# Patient Record
Sex: Male | Born: 1985
Health system: Southern US, Community
[De-identification: ages and names within clinical notes are randomized; demographics above are authoritative.]

## PROBLEM LIST (undated history)

## (undated) DIAGNOSIS — I493 Ventricular premature depolarization: Secondary | ICD-10-CM

## (undated) DIAGNOSIS — F32A Depression, unspecified: Secondary | ICD-10-CM

## (undated) DIAGNOSIS — R0600 Dyspnea, unspecified: Secondary | ICD-10-CM

## (undated) DIAGNOSIS — Z950 Presence of cardiac pacemaker: Secondary | ICD-10-CM

## (undated) DIAGNOSIS — J309 Allergic rhinitis, unspecified: Secondary | ICD-10-CM

## (undated) DIAGNOSIS — J45909 Unspecified asthma, uncomplicated: Secondary | ICD-10-CM

## (undated) DIAGNOSIS — S6990XA Unspecified injury of unspecified wrist, hand and finger(s), initial encounter: Secondary | ICD-10-CM

## (undated) DIAGNOSIS — Q203 Discordant ventriculoarterial connection: Secondary | ICD-10-CM

## (undated) DIAGNOSIS — I519 Heart disease, unspecified: Secondary | ICD-10-CM

## (undated) DIAGNOSIS — I499 Cardiac arrhythmia, unspecified: Secondary | ICD-10-CM

## (undated) DIAGNOSIS — I495 Sick sinus syndrome: Secondary | ICD-10-CM

## (undated) DIAGNOSIS — C801 Malignant (primary) neoplasm, unspecified: Secondary | ICD-10-CM

## (undated) DIAGNOSIS — F329 Major depressive disorder, single episode, unspecified: Secondary | ICD-10-CM

## (undated) DIAGNOSIS — I209 Angina pectoris, unspecified: Secondary | ICD-10-CM

## (undated) HISTORY — PX: TRANSPOSITION OF GREAT VESSELS REPAIR: SHX815

## (undated) HISTORY — DX: Ventricular premature depolarization: I49.3

## (undated) HISTORY — DX: Sick sinus syndrome: I49.5

## (undated) HISTORY — DX: Major depressive disorder, single episode, unspecified: F32.9

## (undated) HISTORY — DX: Unspecified injury of unspecified wrist, hand and finger(s), initial encounter: S69.90XA

## (undated) HISTORY — DX: Allergic rhinitis, unspecified: J30.9

## (undated) HISTORY — DX: Discordant ventriculoarterial connection: Q20.3

## (undated) HISTORY — DX: Depression, unspecified: F32.A

## (undated) HISTORY — PX: WISDOM TOOTH EXTRACTION: SHX21

## (undated) HISTORY — DX: Unspecified asthma, uncomplicated: J45.909

## (undated) HISTORY — DX: Heart disease, unspecified: I51.9

---
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
## 2005-05-06 ENCOUNTER — Ambulatory Visit: Payer: Self-pay | Admitting: Internal Medicine

## 2006-11-24 ENCOUNTER — Emergency Department (HOSPITAL_COMMUNITY): Admission: EM | Admit: 2006-11-24 | Discharge: 2006-11-24 | Payer: Self-pay | Admitting: Emergency Medicine

## 2007-04-15 ENCOUNTER — Encounter: Payer: Self-pay | Admitting: Internal Medicine

## 2007-05-13 ENCOUNTER — Ambulatory Visit: Payer: Self-pay | Admitting: Internal Medicine

## 2007-05-19 DIAGNOSIS — F329 Major depressive disorder, single episode, unspecified: Secondary | ICD-10-CM | POA: Insufficient documentation

## 2008-03-08 ENCOUNTER — Ambulatory Visit: Payer: Self-pay | Admitting: Internal Medicine

## 2008-03-22 ENCOUNTER — Ambulatory Visit: Payer: Self-pay | Admitting: Internal Medicine

## 2008-03-22 LAB — CONVERTED CEMR LAB: Rapid Strep: NEGATIVE

## 2008-11-18 ENCOUNTER — Ambulatory Visit: Payer: Self-pay | Admitting: Internal Medicine

## 2008-11-18 DIAGNOSIS — J069 Acute upper respiratory infection, unspecified: Secondary | ICD-10-CM | POA: Insufficient documentation

## 2011-01-22 NOTE — Assessment & Plan Note (Signed)
[facility]                             PULMONARY OFFICE NOTE   John Myers, John Myers                      MRN:          [id number]  DATE:05/13/2007                            DOB:          06/11/1986    PROBLEM:  This was a young man previously followed at [facility] in 2002, referred then by his family practice doctor for allergy  evaluation of asthma and allergic rhinitis.   HISTORY:  He was skin test positive at [facility] years ago, but I do not  think he was on vaccine.  He has a long history of rhinitis with nasal  congestion, sneezing, drainage and frontal headaches, especially in the  spring and summer.  He is currently much better, expecting improvement  with the fall season.  He has had some recent epistaxis mostly from the  right nare.  He does not get asthma.  He takes lots of Benadryl which  mother describes as tipping up the bottle and drinking it.  This makes  him a little drowsy and cranky.  He has tried most available over-the-  counter antihistamine and antihistamine decongestant products.  He helps  to keep his bedroom as cool as possible.  He is very much worse after  mowing lawns but has not recognized symptoms particularly with mold,  animals or other specific exposures.  Nonspecific irritants including  the strong smell of gasoline cause nasal congestion.  He denies special  problems with foods, insect stings, aspirin, contrast or latex.  He has  failed benefits substantially from Zyrtec or Alavert.  He thinks  Singulair did help until he ran out.   REVIEW OF SYSTEMS:  Occasional palpitations, acid indigestion,  headaches, nasal congestion, sneezing and itching.  Mucus is sometimes  discolored.   MEDICATIONS:  1. Benadryl.  2. Sudafed.  3. Tavist.  4. Actifed.   ALLERGIES:  NO KNOWN DRUG ALLERGIES.   PAST MEDICAL HISTORY:  1. 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  2. Asthma.  3. Allergic rhinitis.  4. Chronic headaches.  5. He has had no ENT surgery and mother says he had few infections.  6. Jaundice was diagnosed as Gilbert's syndrome.  7. Acid reflux.   SOCIAL HISTORY:  Regular beer drinker.  No tobacco.  He is not married.  Works as a Gaffer doing yard work, some odd jobs, Sales promotion account executive.  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  When he mows lawns, he does  come back significantly congested in head.   FAMILY HISTORY:  Allergies and asthma on mother's side.  Allergy vaccine  definitely helped his sister.   ENVIRONMENTAL:  They live in an older home.  No basement.  There is  central air, wood floor, one dog, no smokers.  His bedroom has deer  heads and mounted ducks which we discussed as dust trappers.  They have  put encasing on his mattress but not on his pillow.   OBJECTIVE:  VITAL SIGNS:  Weight 161 pounds, blood  pressure 100/62,  pulse 55, room air saturation 98%.  HEENT:  Persistent sniffing, mucoid rhinitis, tympanic membranes are  clear.  Throat looks clear.  Tonsils are atrophic.  No stridor.  Voice  quality normal.  CHEST:  Clear.  CARDIOVASCULAR:  Heart sounds regular.  There is a 2-3/6 systolic murmur  best heard at the left sternal border with crisp valve closure.  No  diastolic component heard.  EXTREMITIES:  No cyanosis.   IMPRESSION:  Allergic rhinitis with grasping a clearly identified  allergen.   PLAN:  1. Environmental precautions including use of masks when mowing lawn.  2. Retry Singulair 10 mg daily.  3. Try Xyzal 5 mg daily p.r.n.  4. Try Nasonex 2 sprays each nostril daily, tapering to one spray each      nostril daily for maintenance.  5. Schedule return two months, earlier p.r.n.  6. Skin test p.r.n.     Clinton D. Maple Hudson, MD, Tonny Bollman, FACP  Electronically Signed    CDY/MedQ  DD: 05/13/2007  DT: 05/14/2007  Job #: [id number]   cc:   Gordy Savers, MD

## 2011-05-02 ENCOUNTER — Telehealth: Payer: Self-pay

## 2011-05-02 NOTE — Telephone Encounter (Signed)
Triaged a call- father in route to pick up son  "something lodged in throat and vomiting" per dad. Wants to come straight here to be seen - I explained we would be happy to see - but if he has something stuck in throat and it need to be removed we would not be able to do that here - recommend ER . Dad was hoping to avoid ER - but I again instructed that if anything lodge in throat , SOB that would be better seen thru ER ,they would have equipment to eval and remove but if he brought him in we would see . Verbalized understanding. KIK

## 2012-04-06 ENCOUNTER — Encounter: Payer: Self-pay | Admitting: Internal Medicine

## 2012-04-06 ENCOUNTER — Ambulatory Visit (INDEPENDENT_AMBULATORY_CARE_PROVIDER_SITE_OTHER): Payer: Self-pay | Admitting: Internal Medicine

## 2012-04-06 VITALS — BP 100/70 | Temp 98.1°F | Wt 155.0 lb

## 2012-04-06 DIAGNOSIS — B029 Zoster without complications: Secondary | ICD-10-CM

## 2012-04-06 MED ORDER — HYDROCODONE-ACETAMINOPHEN 5-500 MG PO TABS: 1.0000 | ORAL_TABLET | ORAL | Status: DC | PRN

## 2012-04-06 MED ORDER — VALACYCLOVIR HCL 1 G PO TABS: 1000.0000 mg | ORAL_TABLET | Freq: Two times a day (BID) | ORAL | Status: DC

## 2012-04-06 MED ORDER — GABAPENTIN 100 MG PO CAPS: 100.0000 mg | ORAL_CAPSULE | Freq: Three times a day (TID) | ORAL | Status: DC

## 2012-04-06 NOTE — Progress Notes (Signed)
  Subjective:    Patient ID: John Myers, male    DOB: 01/30/1986, 26 y.o.   MRN: [id number]  HPI  26 year old patient who presents with a one-week history of an extensive rash involving his right chest wall area   Review of Systems  Skin: Positive for rash.       Objective:   Physical Exam  Constitutional: He appears well-developed and well-nourished. No distress.  Skin: Rash noted.       Extensive herpetic rash involving the right chest wall area that extends from the midline anteriorly to the midline posteriorly          Assessment & Plan:   Shingles. Will treat with Valtrex Neurontin as well as analgesics A note for work also dispensed

## 2012-04-06 NOTE — Patient Instructions (Addendum)
Shingles Shingles is caused by the same virus that causes chickenpox (varicella zoster virus or VZV). Shingles often occurs many years or decades after having chickenpox. That is why it is more common in adults older than 50 years. The virus reactivates and breaks out as an infection in a nerve root. SYMPTOMS    The initial feeling (sensations) may be pain. This pain is usually described as:   Burning.   Stabbing.   Throbbing.   Tingling in the nerve root.   A red rash will follow in a couple days. The rash may occur in any area of the body and is usually on one side (unilateral) of the body in a band or belt-like pattern. The rash usually starts out as very small blisters (vesicles). They will dry up after 7 to 10 days. This is not usually a significant problem except for the pain it causes.   Long-lasting (chronic) pain is more likely in an elderly person. It can last months to years. This condition is called postherpetic neuralgia.  Shingles can be an extremely severe infection in someone with AIDS, a weakened immune system, or with forms of leukemia. It can also be severe if you are taking transplant medicines or other medicines that weaken the immune system. TREATMENT   Your caregiver will often treat you with:  Antiviral drugs.   Anti-inflammatory drugs.   Pain medicines.  Bed rest is very important in preventing the pain associated with herpes zoster (postherpetic neuralgia). Application of heat in the form of a hot water bottle or electric heating pad or gentle pressure with the hand is recommended to help with the pain or discomfort. PREVENTION   A varicella zoster vaccine is available to help protect against the virus. The Food and Drug Administration approved the varicella zoster vaccine for individuals 63 years of age and older. HOME CARE INSTRUCTIONS    Cool compresses to the area of rash may be helpful.   Only take over-the-counter or prescription medicines for pain,  discomfort, or fever as directed by your caregiver.   Avoid contact with:   Babies.   Pregnant women.   Children with eczema.   Elderly people with transplants.   People with chronic illnesses, such as leukemia and AIDS.   If the area involved is on your face, you may receive a referral for follow-up to a specialist. It is very important to keep all follow-up appointments. This will help avoid eye complications, chronic pain, or disability.  SEEK IMMEDIATE MEDICAL CARE IF:    You develop any pain (headache) in the area of the face or eye. This must be followed carefully by your caregiver or ophthalmologist. An infection in part of your eye (cornea) can be very serious. It could lead to blindness.   You do not have pain relief from prescribed medicines.   Your redness or swelling spreads.   The area involved becomes very swollen and painful.   You have a fever.   You notice any red or painful lines extending away from the affected area toward your heart (lymphangitis).   Your condition is worsening or has changed.  Document Released: 08/26/2005 Document Revised: 08/15/2011 Document Reviewed: 07/31/2009 [facility] Patient Information 2012 [place], [place].Postherpetic Neuralgia Shingles is a painful disease. It is caused by the herpes zoster virus. This is the same virus which also causes chickenpox. It can affect the torso, limbs, or the face. For most people, shingles is a condition of rather sudden onset. Pain usually lasts about  1 month. In older patients, or patients with poor immune systems, a painful, long-standing (chronic) condition called postherpetic neuralgia can develop. This condition rarely happens before age 41. But at least 50% of people over 50 become affected following an attack of shingles. There is a natural tendency for this condition to improve over time with no treatment. Less than 5% of patients have pain that lasts for more than 1 year. DIAGNOSIS   Herpes is  usually easily diagnosed on physical exam. Pain sometimes follows when the skin sores (lesions) have disappeared. It is called postherpetic neuralgia. That name simply means the pain that follows herpes. TREATMENT    Treating this condition may be difficult. Usually one of the tricyclic antidepressants, often amitriptyline, is the first line of treatment. There is evidence that the sooner these medications are given, the more likely they are to reduce pain.   Conventional analgesics, regional nerve blocks, and anticonvulsants have little benefit in most cases when used alone. Other tricyclic anti-depressants are used as a second option if the first antidepressant is unsuccessful.   Anticonvulsants, including carbamazepine, have been found to provide some added benefit when used with a tricyclic anti-depressant. This is especially for the stabbing type of pain similar to that of trigeminal neuralgia.   Chronic opioid therapy. This is a strong narcotic pain medication. It is used to treat pain that is resistant to other measures. The issues of dependency and tolerance can be reduced with closely managed care.   Some cream treatments are applied locally to the affected area. They can help when used with other treatments. Their use may be difficult in the case of postherpetic trigeminal neuralgia. This is involved with the face. So the substances can irritate the eye and the skin around the eye. Examples of creams used include Capsaicin and lidocaine creams.   For shingles, antiviral therapies along with analgesics are recommended. Studies of the effect of anti-viral agents such as acyclovir on shingles have been done. They show improved rates of healing and decreased severity of sudden (acute) pain. Some observations suggest that nerve blocks during shingles infection will:   Reduce pain.   Shorten the acute episode.   Prevent the emergence of postherpetic neuralgia.  Viral medications used include  Acyclovir (Zovirax), Valacyclovir, Famciclovir and a lysine diet. Document Released: 11/16/2002 Document Revised: 08/15/2011 Document Reviewed: 08/26/2005 [facility] Patient Information 2012 [place], [place].

## 2012-04-24 ENCOUNTER — Telehealth: Payer: Self-pay | Admitting: Internal Medicine

## 2012-04-24 MED ORDER — HYDROCODONE-ACETAMINOPHEN 5-500 MG PO TABS: 1.0000 | ORAL_TABLET | Freq: Four times a day (QID) | ORAL | Status: DC | PRN

## 2012-04-24 NOTE — Telephone Encounter (Signed)
Call in Vicodin #60 with no rf

## 2012-04-24 NOTE — Telephone Encounter (Signed)
Pt is still in pain from shingles and is requesting a refill on HYDROcodone-acetaminophen (VICODIN) 5-500 MG per tablet

## 2012-04-24 NOTE — Telephone Encounter (Signed)
Call to [facility] and attempt to call father - VM - (hm#) [id number] if questions - med called in

## 2012-04-24 NOTE — Telephone Encounter (Signed)
There is another phone note that I have sent to another provider for approval since dr. Amador Cunas is out of office until Monday. Just waiting for response.

## 2012-04-24 NOTE — Telephone Encounter (Signed)
Please advise - was given # [phone] 2RFs at 04/06/12 office visit His rash was bad at that time

## 2012-04-24 NOTE — Telephone Encounter (Signed)
Caller: [name]/Father; Patient Name: John Myers; PCP: Eleonore Chiquito; Best Callback Phone Number: [phone] seen in office on 04/06/12 and dx with Shingles and still has burning pain on R side. Skin is very sensitive but rash now gone. He did take full course of Valtrex. Taking Hydrocodone for pain and is almost out of pain med. He has 8 tabs left. (takes 2 tab PO @ HS)  REQUESTING REFILL- HE IS WILLING TO COME BACK INTO OFFICE FOR RECHECK IF NEEDED BUT NO NEW SX. Uses [facility] on [address], [phone].

## 2012-09-09 DIAGNOSIS — I495 Sick sinus syndrome: Secondary | ICD-10-CM

## 2012-09-09 HISTORY — DX: Sick sinus syndrome: I49.5

## 2013-01-22 ENCOUNTER — Ambulatory Visit (INDEPENDENT_AMBULATORY_CARE_PROVIDER_SITE_OTHER): Payer: BC Managed Care – PPO | Admitting: Internal Medicine

## 2013-01-22 ENCOUNTER — Encounter: Payer: Self-pay | Admitting: Internal Medicine

## 2013-01-22 VITALS — BP 110/70 | HR 62 | Temp 98.5°F | Resp 18 | Wt 158.0 lb

## 2013-01-22 DIAGNOSIS — J309 Allergic rhinitis, unspecified: Secondary | ICD-10-CM | POA: Insufficient documentation

## 2013-01-22 DIAGNOSIS — J3089 Other allergic rhinitis: Secondary | ICD-10-CM | POA: Insufficient documentation

## 2013-01-22 DIAGNOSIS — F329 Major depressive disorder, single episode, unspecified: Secondary | ICD-10-CM

## 2013-01-22 MED ORDER — METHYLPREDNISOLONE ACETATE 80 MG/ML IJ SUSP
80.0000 mg | Freq: Once | INTRAMUSCULAR | Status: AC
  Administered 2013-01-22: 80 mg via INTRAMUSCULAR

## 2013-01-22 NOTE — Progress Notes (Signed)
Subjective:    Patient ID: John Myers, male    DOB: 1985-11-09, 27 y.o.   MRN: [id number]  HPI 27 year old patient who has a history of seasonal allergic rhinitis worse in the spring. 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 He describes sinus fullness. There's been no fever localize sinus pain or purulent discharge. Denies any wheezing. He does use Benadryl with the benefit. This does not cause any drowsiness and nonsedating antihistamines have not been as helpful. His chief complaint today is sore throat  History reviewed. No pertinent past medical history.  History   Social History  . Marital Status: Single    Spouse Name: N/A    Number of Children: N/A  . Years of Education: N/A   Occupational History  . Not on file.   Social History Main Topics  . Smoking status: Never Smoker   . Smokeless tobacco: Never Used  . Alcohol Use: Yes  . Drug Use: No  . Sexually Active: Not on file   Other Topics Concern  . Not on file   Social History Narrative  . No narrative on file    History reviewed. No pertinent past surgical history.  No family history on file.  No Known Allergies  No current outpatient prescriptions on file prior to visit.   No current facility-administered medications on file prior to visit.    BP 110/70  Pulse 62  Temp(Src) 98.5 F (36.9 C) (Oral)  Resp 18  Wt 158 lb (71.668 kg)  SpO2 97%       Review of Systems  Constitutional: Negative for fever, chills, appetite change and fatigue.  HENT: Positive for congestion, sore throat, rhinorrhea, postnasal drip and sinus pressure. Negative for hearing loss, ear pain, trouble swallowing, neck stiffness, dental problem, voice change and tinnitus.   Eyes: Negative for pain, discharge and visual disturbance.  Respiratory: Negative for cough, chest tightness, wheezing and stridor.   Cardiovascular: Negative  for chest pain, palpitations and leg swelling.  Gastrointestinal: Negative for nausea, vomiting, abdominal pain, diarrhea, constipation, blood in stool and abdominal distention.  Genitourinary: Negative for urgency, hematuria, flank pain, discharge, difficulty urinating and genital sores.  Musculoskeletal: Negative for myalgias, back pain, joint swelling, arthralgias and gait problem.  Skin: Negative for rash.  Neurological: Negative for dizziness, syncope, speech difficulty, weakness, numbness and headaches.  Hematological: Negative for adenopathy. Does not bruise/bleed easily.  Psychiatric/Behavioral: Negative for behavioral problems and dysphoric mood. The patient is not nervous/anxious.        Objective:   Physical Exam  Constitutional: He is oriented to person, place, and time. He appears well-developed.  HENT:  Head: Normocephalic.  Right Ear: External ear normal.  Left Ear: External ear normal.  Oropharynx is erythematous  Eyes: Conjunctivae and EOM are normal.  Neck: Normal range of motion.  Cardiovascular: Normal rate and normal heart sounds.   Pulmonary/Chest: Breath sounds normal.  Abdominal: Bowel sounds are normal.  Musculoskeletal: Normal range of motion. He exhibits no edema and no tenderness.  Lymphadenopathy:    He has no cervical adenopathy.  Neurological: He is alert and oriented to person, place, and time.  Psychiatric: He has a normal mood and affect. His behavior is normal.          Assessment & Plan:   Allergic rhinitis flare. Will continue Benadryl when necessary bladder expectorants throat lozenge or as and Nasonex. Will  treat with Depo-Medrol 80

## 2013-01-22 NOTE — Patient Instructions (Signed)
Call or return to clinic prn if these symptoms worsen or fail to improve as anticipated. Allergic Rhinitis Allergic rhinitis is when the mucous membranes in the nose respond to allergens. Allergens are particles in the air that cause your body to have an allergic reaction. This causes you to release allergic antibodies. Through a chain of events, these eventually cause you to release histamine into the blood stream (hence the use of antihistamines). Although meant to be protective to the body, it is this release that causes your discomfort, such as frequent sneezing, congestion and an itchy runny nose.  CAUSES  The pollen allergens may come from grasses, trees, and weeds. This is seasonal allergic rhinitis, or "hay fever." Other allergens cause year-round allergic rhinitis (perennial allergic rhinitis) such as house dust mite allergen, pet dander and mold spores.  SYMPTOMS   Nasal stuffiness (congestion).  Runny, itchy nose with sneezing and tearing of the eyes.  There is often an itching of the mouth, eyes and ears. It cannot be cured, but it can be controlled with medications. DIAGNOSIS  If you are unable to determine the offending allergen, skin or blood testing may find it. TREATMENT   Avoid the allergen.  Medications and allergy shots (immunotherapy) can help.  Hay fever may often be treated with antihistamines in pill or nasal spray forms. Antihistamines block the effects of histamine. There are over-the-counter medicines that may help with nasal congestion and swelling around the eyes. Check with your caregiver before taking or giving this medicine. If the treatment above does not work, there are many new medications your caregiver can prescribe. Stronger medications may be used if initial measures are ineffective. Desensitizing injections can be used if medications and avoidance fails. Desensitization is when a patient is given ongoing shots until the body becomes less sensitive to the  allergen. Make sure you follow up with your caregiver if problems continue. SEEK MEDICAL CARE IF:   You develop fever (more than 100.5 F (38.1 C).  You develop a cough that does not stop easily (persistent).  You have shortness of breath.  You start wheezing.  Symptoms interfere with normal daily activities. Document Released: 05/21/2001 Document Revised: 11/18/2011 Document Reviewed: 11/30/2008 [facility] Patient Information 2013 [place], [place].

## 2013-02-05 ENCOUNTER — Encounter: Payer: Self-pay | Admitting: Internal Medicine

## 2013-02-05 ENCOUNTER — Ambulatory Visit (INDEPENDENT_AMBULATORY_CARE_PROVIDER_SITE_OTHER): Payer: BC Managed Care – PPO | Admitting: Internal Medicine

## 2013-02-05 VITALS — BP 110/74 | HR 48 | Resp 24 | Wt 154.0 lb

## 2013-02-05 DIAGNOSIS — R079 Chest pain, unspecified: Secondary | ICD-10-CM

## 2013-02-05 NOTE — Progress Notes (Signed)
  Subjective:    Patient ID: John Myers, male    DOB: [birth year], 27 y.o.   MRN: [id number]  HPI  27 year old patient who presents as a work in with a chief complaint of chest pain. This began last night at approximately 10:30 PM and was present when he awoke this morning the pain is described as a squeezing pressure pain in the mid chest area. He has been quite anxious and a bit short of breath. He is also to complain of some numbness and tingling involving his hands. He has a history of congenital heart disease at age 91 months underwent a Senning procedure in [place] for transposition of the great vessels. He is followed at [facility] in 3 days ago did have a followup cardiology evaluation. He apparently had treadmill exercise stress test performed with a peak heart rate of 190. An echocardiogram was also performed. According to his mother there was some concern about a blockage at the level of the pulmonary veins or pulmonary artery outflow track.  Further evaluation to include a cardiac MRI or possibly transesophageal echo were entertained. His cardiologist also were concerned about the possibility of a pacemaker we'll repeat open heart surgery.    Review of Systems  Constitutional: Negative for fever, chills, appetite change and fatigue.  HENT: Negative for hearing loss, ear pain, congestion, sore throat, trouble swallowing, neck stiffness, dental problem, voice change and tinnitus.   Eyes: Negative for pain, discharge and visual disturbance.  Respiratory: Positive for shortness of breath. Negative for cough, chest tightness, wheezing and stridor.   Cardiovascular: Positive for chest pain. Negative for palpitations and leg swelling.  Gastrointestinal: Negative for nausea, vomiting, abdominal pain, diarrhea, constipation, blood in stool and abdominal distention.  Genitourinary: Negative for urgency, hematuria, flank pain, discharge, difficulty urinating and genital sores.   Musculoskeletal: Negative for myalgias, back pain, joint swelling, arthralgias and gait problem.  Skin: Negative for rash.  Neurological: Positive for weakness and light-headedness. Negative for dizziness, syncope, speech difficulty, numbness and headaches.  Hematological: Negative for adenopathy. Does not bruise/bleed easily.  Psychiatric/Behavioral: Negative for behavioral problems and dysphoric mood. The patient is nervous/anxious.        Objective:   Physical Exam  Constitutional: He is oriented to person, place, and time. He appears well-developed. No distress.  Anxious no acute distress.  HENT:  Head: Normocephalic.  Right Ear: External ear normal.  Left Ear: External ear normal.  Eyes: Conjunctivae and EOM are normal.  Neck: Normal range of motion.  Cardiovascular: Normal rate and normal heart sounds.   Normal S1 single S2 Grade 3/6 systolic murmur loudest along the left sternal border  Pulmonary/Chest: Breath sounds normal.  Sternotomy scar  Abdominal: Bowel sounds are normal.  Musculoskeletal: Normal range of motion. He exhibits no edema and no tenderness.  Neurological: He is alert and oriented to person, place, and time.  Psychiatric: He has a normal mood and affect. His behavior is normal.          Assessment & Plan:   Congenital heart disease. Status post surgery for transposition of the great vessels. Chest pain syndrome. Options discussed with the patient parents and cardiology. The patient will be evaluated at [facility].

## 2013-02-05 NOTE — Patient Instructions (Signed)
Report to [facility], cardiology division immediately for further evaluation

## 2013-02-08 ENCOUNTER — Ambulatory Visit: Payer: BC Managed Care – PPO | Admitting: Family

## 2013-03-08 HISTORY — PX: PACEMAKER INSERTION: SHX728

## 2013-03-24 ENCOUNTER — Other Ambulatory Visit (HOSPITAL_COMMUNITY): Payer: Self-pay | Admitting: Cardiovascular Disease

## 2013-03-24 ENCOUNTER — Ambulatory Visit (HOSPITAL_COMMUNITY)
Admission: RE | Admit: 2013-03-24 | Discharge: 2013-03-24 | Disposition: A | Discharge status: Home / Self Care | Payer: BC Managed Care – PPO | Source: Ambulatory Visit | Attending: Cardiovascular Disease | Admitting: Cardiovascular Disease

## 2013-03-24 DIAGNOSIS — Q243 Pulmonary infundibular stenosis: Secondary | ICD-10-CM

## 2013-03-24 DIAGNOSIS — Z9889 Other specified postprocedural states: Secondary | ICD-10-CM

## 2013-03-24 DIAGNOSIS — Z950 Presence of cardiac pacemaker: Secondary | ICD-10-CM

## 2013-03-24 DIAGNOSIS — I519 Heart disease, unspecified: Secondary | ICD-10-CM

## 2013-03-24 DIAGNOSIS — R079 Chest pain, unspecified: Secondary | ICD-10-CM | POA: Insufficient documentation

## 2013-03-24 DIAGNOSIS — Q203 Discordant ventriculoarterial connection: Secondary | ICD-10-CM

## 2013-04-28 ENCOUNTER — Ambulatory Visit (HOSPITAL_COMMUNITY)
Admission: RE | Admit: 2013-04-28 | Discharge: 2013-04-28 | Disposition: A | Discharge status: Home / Self Care | Payer: BC Managed Care – PPO | Source: Ambulatory Visit | Attending: Cardiovascular Disease | Admitting: Cardiovascular Disease

## 2013-04-28 ENCOUNTER — Other Ambulatory Visit (HOSPITAL_COMMUNITY): Payer: Self-pay | Admitting: Cardiovascular Disease

## 2013-04-28 DIAGNOSIS — Z950 Presence of cardiac pacemaker: Secondary | ICD-10-CM

## 2013-04-28 DIAGNOSIS — Q249 Congenital malformation of heart, unspecified: Secondary | ICD-10-CM | POA: Insufficient documentation

## 2013-07-08 ENCOUNTER — Ambulatory Visit (INDEPENDENT_AMBULATORY_CARE_PROVIDER_SITE_OTHER): Payer: BC Managed Care – PPO | Admitting: Internal Medicine

## 2013-07-08 ENCOUNTER — Encounter: Payer: Self-pay | Admitting: Internal Medicine

## 2013-07-08 ENCOUNTER — Ambulatory Visit (INDEPENDENT_AMBULATORY_CARE_PROVIDER_SITE_OTHER)
Admission: RE | Admit: 2013-07-08 | Discharge: 2013-07-08 | Disposition: A | Discharge status: Home / Self Care | Payer: BC Managed Care – PPO | Source: Ambulatory Visit | Attending: Internal Medicine | Admitting: Internal Medicine

## 2013-07-08 VITALS — BP 110/50 | HR 74 | Temp 97.7°F | Wt 155.0 lb

## 2013-07-08 DIAGNOSIS — Q203 Discordant ventriculoarterial connection: Secondary | ICD-10-CM | POA: Insufficient documentation

## 2013-07-08 DIAGNOSIS — S6980XA Other specified injuries of unspecified wrist, hand and finger(s), initial encounter: Secondary | ICD-10-CM

## 2013-07-08 DIAGNOSIS — L089 Local infection of the skin and subcutaneous tissue, unspecified: Secondary | ICD-10-CM | POA: Insufficient documentation

## 2013-07-08 DIAGNOSIS — S6992XA Unspecified injury of left wrist, hand and finger(s), initial encounter: Secondary | ICD-10-CM

## 2013-07-08 DIAGNOSIS — Z950 Presence of cardiac pacemaker: Secondary | ICD-10-CM

## 2013-07-08 DIAGNOSIS — Z23 Encounter for immunization: Secondary | ICD-10-CM

## 2013-07-08 DIAGNOSIS — S6990XA Unspecified injury of unspecified wrist, hand and finger(s), initial encounter: Secondary | ICD-10-CM | POA: Insufficient documentation

## 2013-07-08 MED ORDER — AMOXICILLIN-POT CLAVULANATE 875-125 MG PO TABS: 1.0000 | ORAL_TABLET | Freq: Two times a day (BID) | ORAL | Status: DC

## 2013-07-08 NOTE — Patient Instructions (Addendum)
Get x ray today  wll let you know results  Add  Antibiotic and soak finger 3-4 x per day   At least warm soapy water   No work hands  Go back Monday   After recheck. Here  Seek emergent care if getting worse  Contact our office . First .

## 2013-07-08 NOTE — Progress Notes (Signed)
Chief Complaint  Patient presents with  . Left pointer finger swelling    Done last week.  Not sure what he cut it on.  Has some numbness.    HPI: Patient comes in today for SDA for  new problem evaluation. 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  Right handed  Had a cut left index finger last week some bleeding  Cleaned  Over the last 2-3 days increased swelling and pain  No fever ? If infected  No prev injury uncertain how cut no know FB poss nial vs razor blade . ROS: See pertinent positives and negatives per HPI. Had CHD s/p correction and recently had pacer for bradycardia and put on ace for dec RV function ( systemic ventriclle) sees [facility].  No tobacco  Cld.  Past Medical History  Diagnosis Date  . TGA (transposition of great arteries)     surgical correction ? which procedure at 3 months  . Cardiac pacemaker     june 2014    No family history on file.  History   Social History  . Marital Status: Single    Spouse Name: N/A    Number of Children: N/A  . Years of Education: N/A   Social History Main Topics  . Smoking status: Never Smoker   . Smokeless tobacco: Never Used  . Alcohol Use: Yes  . Drug Use: No  . Sexual Activity: None   Other Topics Concern  . None   Social History Narrative   Working    Scientist, product/process development     Outpatient Encounter Prescriptions as of 07/08/2013  Medication Sig Dispense Refill  . lisinopril (PRINIVIL,ZESTRIL) 5 MG tablet Take 5 mg by mouth daily.      Marland Kitchen amoxicillin-clavulanate (AUGMENTIN) 875-125 MG per tablet Take 1 tablet by mouth every 12 (twelve) hours.  20 tablet  0  . [DISCONTINUED] diphenhydrAMINE (BENADRYL) 25 MG tablet Take 25 mg by mouth every 6 (six) hours as needed for itching.      . [DISCONTINUED] Pseudoephedrine-DM-GG (TUSSIN COLD/CONGESTION PO) Take 5 mLs by mouth every 6 (six) hours as needed.       No facility-administered encounter medications on file as of 07/08/2013.    EXAM:  BP 110/50  Pulse 74   Temp(Src) 97.7 F (36.5 C) (Oral)  Wt 155 lb (70.308 kg)  SpO2 98%  There is no height on file to calculate BMI.  GENERAL: vitals reviewed and listed above, alert, oriented, appears well hydrated and in no acute distress left index finger 1-2+ swelling distal healed / puncture vs other wound no fluctuance mild redness and warmth  Nail some excoriation no paronychia  Nl rom  Has cap refill  MS: moves all extremities without noticeable focal  abnormality PSYCH: pleasant and cooperative, no obvious depression or anxiety  ASSESSMENT AND PLAN:  Discussed the following assessment and plan:  Infection of index finger  Finger injury, left, initial encounter  Need for prophylactic vaccination with combined diphtheria-tetanus-pertussis (DTP) vaccine - Plan: Tdap vaccine greater than or equal to 7yo IM, DG Finger Index Left  Cardiac pacemaker  TGA (transposition of great arteries) Close observation and intervention if not getting better quickly  No work until Monday after check of prn.  -Patient advised to return or notify health care team  if symptoms worsen or persist or new concerns arise.  Patient Instructions  Get x ray today  wll let you know results  Add  Antibiotic and soak finger 3-4 x per  day   At least warm soapy water   No work hands  Go back Monday   After recheck. Here  Seek emergent care if getting worse  Contact our office . First .      Neta Mends. Panosh M.D.  Juliann Pares ray neg fb other abnormality

## 2013-07-08 NOTE — Progress Notes (Signed)
Quick Note:  Tell patient that x ray shows no acute abnormality. No foreign body ______

## 2013-07-09 ENCOUNTER — Telehealth: Payer: Self-pay | Admitting: Internal Medicine

## 2013-07-09 ENCOUNTER — Encounter: Payer: Self-pay | Admitting: Family Medicine

## 2013-07-09 NOTE — Telephone Encounter (Signed)
Pt received call from our office . Pt unsure who called him.  Did you call pt concerning Xray?

## 2013-07-09 NOTE — Telephone Encounter (Signed)
Patient notified of normal results by telephone.

## 2013-07-12 ENCOUNTER — Ambulatory Visit: Payer: BC Managed Care – PPO | Admitting: Internal Medicine

## 2014-03-07 ENCOUNTER — Encounter: Payer: Self-pay | Admitting: Internal Medicine

## 2014-03-07 ENCOUNTER — Ambulatory Visit (INDEPENDENT_AMBULATORY_CARE_PROVIDER_SITE_OTHER): Payer: BC Managed Care – PPO | Admitting: Internal Medicine

## 2014-03-07 VITALS — BP 116/73 | HR 69 | Ht 70.5 in | Wt 162.0 lb

## 2014-03-07 DIAGNOSIS — R001 Bradycardia, unspecified: Secondary | ICD-10-CM

## 2014-03-07 DIAGNOSIS — IMO0002 Reserved for concepts with insufficient information to code with codable children: Secondary | ICD-10-CM

## 2014-03-07 DIAGNOSIS — T733XXA Exhaustion due to excessive exertion, initial encounter: Secondary | ICD-10-CM

## 2014-03-07 DIAGNOSIS — Q203 Discordant ventriculoarterial connection: Secondary | ICD-10-CM

## 2014-03-07 DIAGNOSIS — Z950 Presence of cardiac pacemaker: Secondary | ICD-10-CM

## 2014-03-07 DIAGNOSIS — R5381 Other malaise: Secondary | ICD-10-CM

## 2014-03-07 DIAGNOSIS — R5383 Other fatigue: Secondary | ICD-10-CM

## 2014-03-07 DIAGNOSIS — R55 Syncope and collapse: Secondary | ICD-10-CM

## 2014-03-07 DIAGNOSIS — I498 Other specified cardiac arrhythmias: Secondary | ICD-10-CM

## 2014-03-07 DIAGNOSIS — I495 Sick sinus syndrome: Secondary | ICD-10-CM

## 2014-03-07 LAB — MDC_IDC_ENUM_SESS_TYPE_INCLINIC
Battery Remaining Longevity: 119 mo
Brady Statistic RA Percent Paced: 40 %
Date Time Interrogation Session: 20150629110553
Lead Channel Impedance Value: 0 Ohm
Lead Channel Pacing Threshold Amplitude: 0.5 V
Lead Channel Sensing Intrinsic Amplitude: 2.8 mV
MDC IDC MSMT BATTERY IMPEDANCE: 158 Ohm
MDC IDC MSMT BATTERY VOLTAGE: 2.77 V
MDC IDC MSMT LEADCHNL RA IMPEDANCE VALUE: 623 Ohm
MDC IDC MSMT LEADCHNL RA PACING THRESHOLD PULSEWIDTH: 0.4 ms
MDC IDC SET LEADCHNL RA PACING AMPLITUDE: 2 V

## 2014-03-07 NOTE — Patient Instructions (Signed)
Your physician recommends that you schedule a follow-up appointment as needed with Dr Allred   

## 2014-03-12 ENCOUNTER — Encounter: Payer: Self-pay | Admitting: Internal Medicine

## 2014-03-12 DIAGNOSIS — I495 Sick sinus syndrome: Secondary | ICD-10-CM | POA: Insufficient documentation

## 2014-03-12 DIAGNOSIS — R5383 Other fatigue: Secondary | ICD-10-CM | POA: Insufficient documentation

## 2014-03-12 DIAGNOSIS — R55 Syncope and collapse: Secondary | ICD-10-CM | POA: Insufficient documentation

## 2014-03-12 NOTE — Progress Notes (Signed)
Primary Care Physician: Nyoka Cowden, MD Referring Physician:  Self referred Primary Cardiology:  [facility] (Dr Riccardo Dubin) Primary EP:  [facility] (Dr Georgiann Hahn)   John Myers is a 28 y.o. male with a h/o D-TGA s/p Senning procedure and sinus node dysfunction s/p MDT AAI pacemaker at [facility] by Dr Geanie Kenning who presents today for second EP opinion.  (I know the patient's sister very well and she asked that he see me in consultation). He is accompanied by his mother today who assists with the history. The patient underwent surgical repair (Senning per [facility] notes) at 68 months of age.  He had a very active childhood/ early adulthood.  He appears to have had progressing decline over the past few years.  He has had progressive symptoms of fatigue and decreased exercise tolerance over the past 1-2 years.  He also had episodic dizziness/ presyncope.  Echo has revealed progressive RV dysfunction.  He was also found to have sinus bradycardia for which he underwent PPM implant by Dr Alinda Money a year ago. Per records, he was having frequent sinus bradycardia into the 30s (nocturnal) as well as HR 43-64 bpm with junctional rhythm. He also has a h/o vasovagal presyncope diagnosed by Dr Geanie Kenning.  Though the patient feels that most of his difficulty started with PPM implant (and attributes his troubles to his pacemaker), careful history from the patient and his mother suggest to me that his symptoms actually did precede the pacemaker.   Since Dr Kathlee Nations retirement, Dr Neena Rhymes has followed the pacemaker.  He has documented AV wenckebach as the cause for palpitations. Dr Aida Puffer has been following RV decline.  The patient has been encouraged to take both beta blockers and ace inhibitor previously but has been noncomplaint with these therapies.  He says that both make him feel "more fatigued".  The patient does snore occasionally and does not feel well  rested upon waking.  He declines sleep study at this time.  Today, he denies symptoms of chest pain, shortness of breath, orthopnea, PND, lower extremity edema, syncope, or neurologic sequela.  His presyncope has improved with PPM implantation. The patient is tolerating medications without difficulties and is otherwise without complaint today.   Past Medical History  Diagnosis Date  . TGA (transposition of great arteries)     surgical correction at [facility] at 41 months of age  . Sick sinus syndrome 2014    s/p PPM  . Allergic rhinitis   . Depression   . Finger injury   . Premature ventricular contractions   . Right ventricular dysfunction    Past Surgical History  Procedure Laterality Date  . Pacemaker insertion  03/08/13    MDT Adapta SR (atrial) PPM implanted by Dr Geanie Kenning at [facility] for sick sinus syndrome  . Transposition of great vessels repair      Senning procedure performed at 13 months of age at [facility].    Current Outpatient Prescriptions  Medication Sig Dispense Refill  . diphenhydrAMINE (BENADRYL) 25 MG tablet Take 25 mg by mouth as needed.       No current facility-administered medications for this visit.    No Known Allergies  History   Social History  . Marital Status: Single    Spouse Name: N/A    Number of Children: N/A  . Years of Education: N/A   Occupational History  . Not on file.   Social History Main Topics  . Smoking status: Never Smoker   .  Smokeless tobacco: Never Used  . Alcohol Use: Yes  . Drug Use: No  . Sexual Activity: Not on file   Other Topics Concern  . 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 Hypertension    . Hyperlipidemia    . CAD      ROS- All systems are reviewed and negative except as per the HPI above  Physical Exam: Filed  Vitals:   03/07/14 0839  BP: 116/73  Pulse: 69  Height: 5' 10.5" (1.791 m)  Weight: 162 lb (73.483 kg)    GEN- The patient is well appearing, alert and oriented x 3 today.   Head- normocephalic, atraumatic Eyes-  Sclera clear, conjunctiva pink Ears- hearing intact Oropharynx- clear Neck- supple, no JVP Lymph- no cervical lymphadenopathy Lungs- Clear to ausculation bilaterally, normal work of breathing Heart- Regular rate and rhythm, 2/6 crescendo/ decrescendo murmur of the LSB GI- soft, NT, ND, + BS Extremities- no clubbing, cyanosis, or edema MS- no significant deformity or atrophy Skin- pacemaker pocket is well healed, sternotomy is well healed Psych- euthymic mood, full affect Neuro- strength and sensation are intact  EKG today reveals atrial pacing at 70bpm, PR 190, incomplete RBBB, RVH, Qtc 451 Echo 06/24/13 from [place] is reviewed and reveals mild to moderately decreased RV systolic function, normal LV function, mild TR, mild subpulmonic PS, no baffle leaks/obstruction. Echo 02/18/14 from [place] reveals moderate RV dysfunction. Dr Neena Rhymes and Dr Vicente Serene notes are reviewed Device interrogation today is reviewed  Assessment and Plan:  1. Sick sinus syndrome/  Normal pacemaker function He is 40% A paced.  Historgrams appear reasonable.  He is programmed AAI.  As he is 40% A paced, he could perhaps benefit from turning rate control on.  I have made no changes today. I think that it would be reasonable to perform either GXT to evaluate rate response to exercise or perhaps more helpful would be a CPX exam to better characterize his functional limitations. I have not made arrangements for these today.  I have instructed him to follow closely with Dr Neena Rhymes.    2. Fatigue/ decreased exercise tolerance Consider above testing/ reprogramming (not ordered by me today) Sleep study is also encouraged given daytime somnolence.  He does not wish to have any testing done today. I have  encouraged compliance with medical therapy as directed by Dr Aida Puffer for RV dysfunction.  He will follow-up with his primary team at [facility] and I will see as needed going forward.

## 2014-03-17 ENCOUNTER — Encounter: Payer: Self-pay | Admitting: Internal Medicine

## 2014-04-01 ENCOUNTER — Encounter: Payer: Self-pay | Admitting: Physician Assistant

## 2014-04-01 ENCOUNTER — Ambulatory Visit (INDEPENDENT_AMBULATORY_CARE_PROVIDER_SITE_OTHER): Payer: BC Managed Care – PPO | Admitting: Physician Assistant

## 2014-04-01 VITALS — BP 100/70 | HR 72 | Temp 97.9°F | Resp 18 | Wt 164.0 lb

## 2014-04-01 DIAGNOSIS — B029 Zoster without complications: Secondary | ICD-10-CM

## 2014-04-01 MED ORDER — HYDROCODONE-ACETAMINOPHEN 5-325 MG PO TABS: 1.0000 | ORAL_TABLET | Freq: Three times a day (TID) | ORAL | Status: DC | PRN

## 2014-04-01 MED ORDER — HYDROCODONE-ACETAMINOPHEN 5-500 MG PO TABS: 1.0000 | ORAL_TABLET | Freq: Three times a day (TID) | ORAL | Status: DC | PRN

## 2014-04-01 MED ORDER — VALACYCLOVIR HCL 1 G PO TABS: 1000.0000 mg | ORAL_TABLET | Freq: Three times a day (TID) | ORAL | Status: DC

## 2014-04-01 NOTE — Addendum Note (Signed)
Addended by: Colleen Can on: 04/01/2014 09:32 AM   Modules accepted: Orders

## 2014-04-01 NOTE — Addendum Note (Signed)
Addended by: Colleen Can on: 04/01/2014 09:39 AM   Modules accepted: Orders

## 2014-04-01 NOTE — Progress Notes (Signed)
Pre visit review using our clinic review tool, if applicable. No additional management support is needed unless otherwise documented below in the visit note. 

## 2014-04-01 NOTE — Patient Instructions (Addendum)
Valtrex 3 times per day for 7 days to treat shingles.  Vicodin 1 every 8 hours as needed for pain. This will inhibit your ability to operate a motor vehicle, so NO DRIVING WHILE TAKING.  If emergency symptoms discussed during visit developed, seek medical attention immediately.  Followup as needed, or for worsening or persistent symptoms despite treatment.    Shingles Shingles is caused by the same virus that causes chickenpox. The first feelings may be pain or tingling. A rash will follow in a couple days. The rash may occur on any area of the body. Long-lasting pain is more likely in an elderly person. It can last months to years. There are medicines that can help prevent pain if you start taking them early. HOME CARE   Take cool baths or place cool cloths on the rash as told by your doctor.  Take medicine only as told by your doctor.  Rest as told by your doctor.  Keep your rash clean with mild soap and cool water or as told by your doctor.  Do not scratch your rash. You may use calamine lotion to relieve itchy skin as told by your doctor.  Keep your rash covered with a loose bandage (dressing).  Avoid touching:  Babies.  Pregnant women.  Children with inflamed skin (eczema).  People who have gotten organ transplants.  People with chronic illnesses, such as leukemia or AIDS.  Wear loose-fitting clothing.  If the rash is on the face, you may need to see a specialist. Keep all appointments. Shingles must be kept away from the eyes, if possible.  Keep all follow-up visits as told by your doctor. GET HELP RIGHT AWAY IF:   You have any pain on the face or eye.  You lose feeling on one side of your face.  You have ear pain or ringing in your ear.  You cannot taste as well.  Your medicines do not help the pain.  Your redness or puffiness (swelling) spreads.  You feel like you are getting worse.  You have a fever. MAKE SURE YOU:   Understand these  instructions.  Will watch your condition.  Will get help right away if you are not doing well or get worse. Document Released: 02/12/2008 Document Revised: 01/10/2014 Document Reviewed: 02/12/2008 [facility] Patient Information 2015 [place], [place]. This information is not intended to replace advice given to you by your health care provider. Make sure you discuss any questions you have with your health care provider.

## 2014-04-01 NOTE — Progress Notes (Signed)
Subjective:    Patient ID: John Myers, male    DOB: [birth year], 28 y.o.   MRN: [id number]  HPI Patient is a 28 y.o. male presenting for right side pain. 5 days. Only on his right side of torso involving chest wall and some flank. No radiation of pain. 8/10 aching, pain, tender even to light touch. He denies redness and swelling. He states that it feels like the beginning of shingles, had shingles in the same area about 2 years ago. Pt states he was at the beach last weekend, fishing on a boat, and leaning over a rail, which he thought may have caused his symptoms, but really he still believes this is more like the shingles pain. States he has been taking ibuprofen daily, which really hasn't helped. He also tried biofreeze, which numbed the area slightly, but wore off quickly. His only other symptom has been a headache, which he also had with shingles last time. Patient denies fevers, chills, nausea, vomiting, diarrhea, shortness of breath, chest pain, syncope, and denies rash currently.    Review of Systems As per HPI and are otherwise negative.    Past Medical History  Diagnosis Date  . TGA (transposition of great arteries)     surgical correction at [facility] at 43 months of age  . Sick sinus syndrome 2014    s/p PPM  . Allergic rhinitis   . Depression   . Finger injury   . Premature ventricular contractions   . Right ventricular dysfunction     History   Social History  . Marital Status: Single    Spouse Name: N/A    Number of Children: N/A  . Years of Education: N/A   Occupational History  . Not on file.   Social History Main Topics  . Smoking status: Never Smoker   . Smokeless tobacco: Never Used  . Alcohol Use: Yes  . Drug Use: No  . Sexual Activity: Not on file   Other Topics Concern  . 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 Pacemaker insertion  03/08/13    MDT Adapta SR (atrial) PPM implanted by Dr Geanie Kenning at [facility] for sick sinus syndrome  . Transposition of great vessels repair      Senning procedure performed at 27 months of age at [facility].    Family History  Problem Relation Age of Onset  . Hypertension    . Hyperlipidemia    . CAD      No Known Allergies  Current Outpatient Prescriptions on File Prior to Visit  Medication Sig Dispense Refill  . diphenhydrAMINE (BENADRYL) 25 MG tablet Take 25 mg by mouth as needed.       No current facility-administered medications on file prior to visit.    EXAM: BP 100/70  Pulse 72  Temp(Src) 97.9 F (36.6 C) (Oral)  Resp 18  Wt 164 lb (74.39 kg)     Objective:   Physical Exam  Nursing note and vitals reviewed. Constitutional: He is oriented to person, place, and time. He appears well-developed and well-nourished. No distress.  HENT:  Head: Normocephalic and atraumatic.  Eyes: Conjunctivae and EOM are normal. Pupils are equal, round, and reactive to light.  Cardiovascular: Normal rate, regular  rhythm and intact distal pulses.   Pulmonary/Chest: Effort normal and breath sounds normal. No respiratory distress. He exhibits no tenderness.  Neurological: He is alert and oriented to person, place, and time.  Skin: Skin is warm and dry. Rash noted. He is not diaphoretic. No erythema. No pallor.  Cluster of papules seen on the right chest wall in the area of pain. Pt states he hadn't noticed this prior to visit.  Psychiatric: He has a normal mood and affect. His behavior is normal. Judgment and thought content normal.     No results found for this basename: WBC, HGB, HCT, PLT, GLUCOSE, CHOL, TRIG, HDL, LDLDIRECT, LDLCALC, ALT, AST, NA, K, CL, CREATININE, BUN, CO2, TSH, PSA, INR, GLUF, HGBA1C, MICROALBUR        Assessment & Plan:  [place] was seen today for right sided  pain.  Diagnoses and associated orders for this visit:  Shingles Comments: With prodromal pain, rash starting to develope today. Will rx Valtrex, hydrocodone for pain. - valACYclovir (VALTREX) 1000 MG tablet; Take 1 tablet (1,000 mg total) by mouth 3 (three) times daily. - HYDROcodone-acetaminophen (VICODIN) 5-500 MG per tablet; Take 1 tablet by mouth every 8 (eight) hours as needed for pain (No Driving While Taking this medication. It will inhibit your abiltiy to operate a motor vehicle.).    Return precautions provided, and patient handout on shingles.  Plan to follow up as needed, or for worsening or persistent symptoms despite treatment.  Patient Instructions  Valtrex 3 times per day for 7 days to treat shingles.  Vicodin 1 every 8 hours as needed for pain. This will inhibit your ability to operate a motor vehicle, so NO DRIVING WHILE TAKING.  If emergency symptoms discussed during visit developed, seek medical attention immediately.  Followup as needed, or for worsening or persistent symptoms despite treatment.

## 2014-04-11 ENCOUNTER — Other Ambulatory Visit: Payer: Self-pay | Admitting: Physician Assistant

## 2014-04-13 ENCOUNTER — Telehealth: Payer: Self-pay | Admitting: *Deleted

## 2014-04-13 NOTE — Telephone Encounter (Signed)
Spoke with patient.  Dr Rayann Heman recommended GXT testing to evaluate heart rate response to exercise with programming adjustments made if needed.  The patient is interested in this, but is concerned currently about his financial situation.  Will discuss with Dr Rayann Heman role of turning on rate response and evaluating exercise tolerance subjectively.  Pt aware I will call him back next week after Dr Rayann Heman returns to office.   Chanetta Marshall, RN, BSN 04/13/2014 9:44 AM

## 2014-04-18 NOTE — Telephone Encounter (Signed)
I think that this is reasonable.  Though he has a histogram which appears reasonable, he may require higher heart rates given his young age.

## 2014-04-22 NOTE — Telephone Encounter (Signed)
EP scheduler attempted to call patient to offer appt next week to change rate response programming on pacemaker.  He declined appt at this time stating that he wasn't able to come in.  He will call us back when he has availability and we can attempt to adjust rate response at that time.

## 2014-05-26 ENCOUNTER — Encounter: Payer: Self-pay | Admitting: Family Medicine

## 2014-05-26 ENCOUNTER — Ambulatory Visit (INDEPENDENT_AMBULATORY_CARE_PROVIDER_SITE_OTHER): Payer: BC Managed Care – PPO | Admitting: Family Medicine

## 2014-05-26 VITALS — BP 100/64 | HR 92 | Temp 98.3°F | Ht 70.5 in | Wt 159.5 lb

## 2014-05-26 DIAGNOSIS — J3089 Other allergic rhinitis: Secondary | ICD-10-CM

## 2014-05-26 DIAGNOSIS — J302 Other seasonal allergic rhinitis: Secondary | ICD-10-CM

## 2014-05-26 DIAGNOSIS — J209 Acute bronchitis, unspecified: Secondary | ICD-10-CM

## 2014-05-26 MED ORDER — PREDNISONE 20 MG PO TABS: 40.0000 mg | ORAL_TABLET | Freq: Every day | ORAL | Status: DC

## 2014-05-26 NOTE — Patient Instructions (Signed)

## 2014-05-26 NOTE — Progress Notes (Signed)
Pre visit review using our clinic review tool, if applicable. No additional management support is needed unless otherwise documented below in the visit note. 

## 2014-05-26 NOTE — Progress Notes (Signed)
No chief complaint on file.   HPI:   -started: 2 days ago -symptoms:nasal congestion, sore throat, cough, sinus pressure, mild SOB -denies:fever, NVD, tooth pain -has tried: sudafed, take benadryl for his allergies - reports INS and other antihistamines dont work for his allergies, saw allergist a long time ago -sick contacts/travel/risks: denies flu exposure, tick exposure or or Ebola risks -Hx of: allergies  ROS: See pertinent positives and negatives per HPI.  Past Medical History  Diagnosis Date  . TGA (transposition of great arteries)     surgical correction at [facility] at 63 months of age  . Sick sinus syndrome 2014    s/p PPM  . Allergic rhinitis   . Depression   . Finger injury   . Premature ventricular contractions   . Right ventricular dysfunction     Past Surgical History  Procedure Laterality Date  . Pacemaker insertion  03/08/13    MDT Adapta SR (atrial) PPM implanted by Dr Geanie Kenning at [facility] for sick sinus syndrome  . Transposition of great vessels repair      Senning procedure performed at 61 months of age at [facility].    Family History  Problem Relation Age of Onset  . Hypertension    . Hyperlipidemia    . CAD      History   Social History  . Marital Status: Single    Spouse Name: N/A    Number of Children: N/A  . Years of Education: N/A   Social History Main Topics  . Smoking status: Never Smoker   . Smokeless tobacco: Never Used  . Alcohol Use: Yes  . Drug Use: No  . Sexual Activity: None   Other Topics Concern  . 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 No  driving while taking this medication. It will inhibit your ability to operate a motor vehicle., Disp: 30 tablet, Rfl: 0 predniSONE (DELTASONE) 20 MG tablet, Take 2 tablets (40 mg total) by mouth daily with breakfast., Disp: 8 tablet, Rfl: 0  EXAM:  Filed Vitals:   05/26/14 1249  BP: 100/64  Pulse: 92  Temp: 98.3 F (36.8 C)    Body mass index is 22.55 kg/(m^2).  GENERAL: vitals reviewed and listed above, alert, oriented, appears well hydrated and in no acute distress  HEENT: atraumatic, conjunttiva clear, no obvious abnormalities on inspection of external nose and ears, normal appearance of ear canals and TMs, clear nasal congestion, mild post oropharyngeal erythema with PND, no tonsillar edema or exudate, no sinus TTP  NECK: no obvious masses on inspection  LUNGS: clear to auscultation bilaterally, no wheezes, rales or rhonchi, good air movement  CV: HRRR, no peripheral edema  MS: moves all extremities without noticeable abnormality  PSYCH: pleasant and cooperative, no obvious depression or anxiety  ASSESSMENT AND PLAN:  Discussed the following assessment and plan:  Other seasonal allergic rhinitis - Plan: predniSONE (DELTASONE) 20 MG tablet  Acute bronchitis, unspecified organism - Plan: predniSONE (DELTASONE) 20 MG tablet  -given HPI and exam findings today, a serious infection or illness is unlikely. We discussed potential  etiologies, with AR/ VURI with mild acute bronchitis being most likely, and advised supportive care and monitoring. We discussed treatment side effects, likely course, antibiotic misuse, transmission, and signs of developing a serious illness. -we discussed other options for his allergies - but he does not wish to do any of them as he reports benadryl is the only thing that works for this -of course, we advised to return or notify a doctor immediately if symptoms worsen or persist or new concerns arise.    Patient Instructions  Acute  Bronchitis Bronchitis is inflammation of the airways that extend from the windpipe into the lungs (bronchi). The inflammation often causes mucus to develop. This leads to a cough, which is the most common symptom of bronchitis.  In acute bronchitis, the condition usually develops suddenly and goes away over time, usually in a couple weeks. Smoking, allergies, and asthma can make bronchitis worse. Repeated episodes of bronchitis may cause further lung problems.  CAUSES Acute bronchitis is most often caused by the same virus that causes a cold. The virus can spread from person to person (contagious) through coughing, sneezing, and touching contaminated objects. SIGNS AND SYMPTOMS   Cough.   Fever.   Coughing up mucus.   Body aches.   Chest congestion.   Chills.   Shortness of breath.   Sore throat.  DIAGNOSIS  Acute bronchitis is usually diagnosed through a physical exam. Your health care provider will also ask you questions about your medical history. Tests, such as chest X-rays, are sometimes done to rule out other conditions.  TREATMENT  Acute bronchitis usually goes away in a couple weeks. Oftentimes, no medical treatment is necessary. Medicines are sometimes given for relief of fever or cough. Antibiotic medicines are usually not needed but may be prescribed in certain situations. In some cases, an inhaler may be recommended to help reduce shortness of breath and control the cough. A cool mist vaporizer may also be used to help thin bronchial secretions and make it easier to clear the chest.  HOME CARE INSTRUCTIONS  Get plenty of rest.   Drink enough fluids to keep your urine clear or pale yellow (unless you have a medical condition that requires fluid restriction). Increasing fluids may help thin your respiratory secretions (sputum) and reduce chest congestion, and it will prevent dehydration.   Take medicines only as directed by your health care provider.  If you were  prescribed an antibiotic medicine, finish it all even if you start to feel better.  Avoid smoking and secondhand smoke. Exposure to cigarette smoke or irritating chemicals will make bronchitis worse. If you are a smoker, consider using nicotine gum or skin patches to help control withdrawal symptoms. Quitting smoking will help your lungs heal faster.   Reduce the chances of another bout of acute bronchitis by washing your hands frequently, avoiding people with cold symptoms, and trying not to touch your hands to your mouth, nose, or eyes.   Keep all follow-up visits as directed by your health care provider.  SEEK MEDICAL CARE IF: Your symptoms do not improve after 1 week of treatment.  SEEK IMMEDIATE MEDICAL CARE IF:  You develop an increased fever or chills.   You have chest pain.   You have severe shortness of breath.  You have bloody sputum.   You develop dehydration.  You faint or repeatedly feel like you are going to pass out.  You develop repeated vomiting.  You develop a severe headache. MAKE SURE YOU:  Understand these instructions.  Will watch your condition.  Will get help right away if you are not doing well or get worse. Document Released: 10/03/2004 Document Revised: 01/10/2014 Document Reviewed: 02/16/2013 [facility] Patient Information 2015 [place], [place]. This information is not intended to replace advice given to you by your health care provider. Make sure you discuss any questions you have with your health care provider.      Colin Benton R.

## 2014-08-29 ENCOUNTER — Other Ambulatory Visit: Payer: Self-pay | Admitting: Orthopedic Surgery

## 2014-09-23 ENCOUNTER — Encounter (HOSPITAL_BASED_OUTPATIENT_CLINIC_OR_DEPARTMENT_OTHER): Admission: RE | Payer: Self-pay | Source: Ambulatory Visit

## 2014-09-23 ENCOUNTER — Ambulatory Visit (HOSPITAL_BASED_OUTPATIENT_CLINIC_OR_DEPARTMENT_OTHER): Admission: RE | Admit: 2014-09-23 | Payer: BC Managed Care – PPO | Source: Ambulatory Visit | Admitting: Specialist

## 2014-09-23 SURGERY — SHOULDER ARTHROSCOPY WITH SUBACROMIAL DECOMPRESSION AND BICEP TENDON REPAIR
Anesthesia: General | Laterality: Left

## 2015-01-30 ENCOUNTER — Encounter: Payer: Self-pay | Admitting: Internal Medicine

## 2015-03-09 ENCOUNTER — Ambulatory Visit (INDEPENDENT_AMBULATORY_CARE_PROVIDER_SITE_OTHER): Payer: BLUE CROSS/BLUE SHIELD | Admitting: Family Medicine

## 2015-03-09 ENCOUNTER — Encounter: Payer: Self-pay | Admitting: Family Medicine

## 2015-03-09 VITALS — BP 122/72 | HR 70 | Temp 98.5°F | Wt 164.0 lb

## 2015-03-09 DIAGNOSIS — H8111 Benign paroxysmal vertigo, right ear: Secondary | ICD-10-CM

## 2015-03-09 DIAGNOSIS — H8110 Benign paroxysmal vertigo, unspecified ear: Secondary | ICD-10-CM | POA: Insufficient documentation

## 2015-03-09 MED ORDER — ONDANSETRON 4 MG PO TBDP: 4.0000 mg | ORAL_TABLET | Freq: Three times a day (TID) | ORAL | Status: DC | PRN

## 2015-03-09 MED ORDER — MECLIZINE HCL 25 MG PO TABS: 25.0000 mg | ORAL_TABLET | Freq: Three times a day (TID) | ORAL | Status: DC | PRN

## 2015-03-09 NOTE — Assessment & Plan Note (Deleted)
Reports minutes of symptoms and some tinnitus (but has some intermittently at baseline). No hearing loss- low suspicion Meniere's. On dix hallpike has classic positive finding on right exam. Relief of symptoms after modified epley performed. Very likely this represents BPPV. Treat with meclizine prn as well as zofran prn for nausea, modified epley at home 3x a day until veritgo resolved 24 hours. Follow up if persistent or if new or worsening symptoms (especially worsening tinnitus or new hearing loss). Could place referral to vestibular rehab.

## 2015-03-09 NOTE — Assessment & Plan Note (Addendum)
Reports minutes of symptoms and some tinnitus (but has some intermittently at baseline). No hearing loss- low suspicion Meniere's. Normal neuro exam also reassuring. On dix hallpike has classic positive finding on right exam. Relief of symptoms after modified epley performed. Very likely this represents BPPV. Treat with meclizine prn as well as zofran prn for nausea, modified epley at home 3x a day until veritgo resolved 24 hours. Follow up if persistent or if new or worsening symptoms (especially worsening tinnitus or new hearing loss). Could place referral to vestibular rehab.

## 2015-03-09 NOTE — Patient Instructions (Addendum)
Do rehab exercises 3x a day until vertigo gone for 24 hours.  If still having issues next week, we can refer to vestibular rehab for further assistance.   Benign Positional Vertigo Vertigo means you feel like you or your surroundings are moving when they are not. Benign positional vertigo is the most common form of vertigo. Benign means that the cause of your condition is not serious. Benign positional vertigo is more common in older adults. CAUSES  Benign positional vertigo is the result of an upset in the labyrinth system. This is an area in the middle ear that helps control your balance. This may be caused by a viral infection, head injury, or repetitive motion. However, often no specific cause is found. SYMPTOMS  Symptoms of benign positional vertigo occur when you move your head or eyes in different directions. Some of the symptoms may include:  Loss of balance and falls.  Vomiting.  Blurred vision.  Dizziness.  Nausea.  Involuntary eye movements (nystagmus). DIAGNOSIS  Benign positional vertigo is usually diagnosed by physical exam. If the specific cause of your benign positional vertigo is unknown, your caregiver may perform imaging tests, such as magnetic resonance imaging (MRI) or computed tomography (CT). TREATMENT  Your caregiver may recommend movements or procedures to correct the benign positional vertigo. Medicines such as meclizine, benzodiazepines, and medicines for nausea may be used to treat your symptoms. In rare cases, if your symptoms are caused by certain conditions that affect the inner ear, you may need surgery. HOME CARE INSTRUCTIONS   Follow your caregiver's instructions.  Move slowly. Do not make sudden body or head movements.  Avoid driving.  Avoid operating heavy machinery.  Avoid performing any tasks that would be dangerous to you or others during a vertigo episode.  Drink enough fluids to keep your urine clear or pale yellow. SEEK IMMEDIATE MEDICAL  CARE IF:   You develop problems with walking, weakness, numbness, or using your arms, hands, or legs.  You have difficulty speaking.  You develop severe headaches.  Your nausea or vomiting continues or gets worse.  You develop visual changes.  Your family or friends notice any behavioral changes.  Your condition gets worse.  You have a fever.  You develop a stiff neck or sensitivity to light. MAKE SURE YOU:   Understand these instructions.  Will watch your condition.  Will get help right away if you are not doing well or get worse. Document Released: 06/03/2006 Document Revised: 11/18/2011 Document Reviewed: 05/16/2011 [facility] Patient Information 2015 [place], [place]. This information is not intended to replace advice given to you by your health care provider. Make sure you discuss any questions you have with your health care provider.

## 2015-03-09 NOTE — Progress Notes (Signed)
John Reddish, MD  Subjective:  John Myers is a 29 y.o. year old very pleasant male patient who presents with:  Vertigo -Woke up this morning and room started spinning. Has had it before with illnesses but never without illness. No cough/cold/congestion recently. Worse with looking up. If lays down flat, room starts to spin. Lasts for a few minutes then goes away. Has rarely had tinnitus in past but definitely has noted some tinnitus today as well. Denies trouble hearing. Reports having vertigo that lasted for a month after an illness in the past but that vertigo was persistent. No treatments tried. Takes benadryl at times but has not taken today. nauea with vertigo episodes  ROS- no cough, congestion, blurry vision, no falls, chest pain, shortness of breath.   Past Medical History- History transposition of great vessels s/p repair as a child, cardiac pacemaker in place for sick sinus  Medications- reviewed and updated Current Outpatient Prescriptions  Medication Sig Dispense Refill  . lisinopril (PRINIVIL,ZESTRIL) 5 MG tablet Take 5 mg by mouth daily.    . diphenhydrAMINE (BENADRYL) 25 MG tablet Take 25 mg by mouth as needed.     Objective: BP 122/72 mmHg  Pulse 70  Temp(Src) 98.5 F (36.9 C)  Wt 164 lb (74.39 kg) Gen: NAD, resting comfortably in chair, when lays down, gets distressed due to vertigo HEENT: NCAT. PERRLA.  CV: RRR no mrg  Lungs: CTAB  MSK: moves all extremities, no edema , normal gait Skin: warm and dry, no rash  Neuro: CN II-XII intact, sensation and reflexes normal throughout, 5/5 muscle strength in bilateral upper and lower extremities. Normal finger to nose. Normal rapid alternating movements. Normal romberg. No pronator drift.  Dix hallpike negative on left, on right produces nystagmus and severe vertigo as well as nausea  Modified epley (exercises he will use at home ) were completed x1 with relief of symptoms.    Assessment/Plan:  BPPV (benign  paroxysmal positional vertigo) Reports minutes of symptoms and some tinnitus (but has some intermittently at baseline). No hearing loss- low suspicion Meniere's. Normal neuro exam also reassuring. On dix hallpike has classic positive finding on right exam. Relief of symptoms after modified epley performed. Very likely this represents BPPV. Treat with meclizine prn as well as zofran prn for nausea, modified epley at home 3x a day until veritgo resolved 24 hours. Follow up if persistent or if new or worsening symptoms (especially worsening tinnitus or new hearing loss). Could place referral to vestibular rehab.   Meds ordered this encounter  . meclizine (ANTIVERT) 25 MG tablet    Sig: Take 1 tablet (25 mg total) by mouth 3 (three) times daily as needed for dizziness.    Dispense:  30 tablet    Refill:  0  . ondansetron (ZOFRAN-ODT) 4 MG disintegrating tablet    Sig: Take 1 tablet (4 mg total) by mouth every 8 (eight) hours as needed for nausea or vomiting.    Dispense:  20 tablet    Refill:  0

## 2015-03-20 ENCOUNTER — Telehealth: Payer: Self-pay | Admitting: Internal Medicine

## 2015-03-20 NOTE — Telephone Encounter (Signed)
Spoke w/pt and pt having problems with shoulder pain and pacemaker feels like it has moved. Also, per pt thinks ppm is not programmed correctly. Was working on getting appointment with JA and pt hung up. Tried to call pt back but did not answer. Pt does not have voicemail set up so could not leave message. Was going to offer pt appointment for 04-03-15 @ 1145 with JA.

## 2015-03-20 NOTE — Telephone Encounter (Signed)
Returned call and scheduled pt for 04-03-15 @ 1145 with JA.

## 2015-03-20 NOTE — Telephone Encounter (Signed)
Follow Up        Pt returning Kristin's phone call.

## 2015-03-20 NOTE — Telephone Encounter (Signed)
New Message  Pt calling to speak about his device: pt stated he thinks his device has moved. Please call back and discuss.

## 2015-04-03 ENCOUNTER — Ambulatory Visit (INDEPENDENT_AMBULATORY_CARE_PROVIDER_SITE_OTHER): Payer: BLUE CROSS/BLUE SHIELD | Admitting: Internal Medicine

## 2015-04-03 ENCOUNTER — Encounter: Payer: Self-pay | Admitting: Internal Medicine

## 2015-04-03 VITALS — BP 101/80 | HR 70 | Ht 71.0 in | Wt 163.4 lb

## 2015-04-03 DIAGNOSIS — Q203 Discordant ventriculoarterial connection: Secondary | ICD-10-CM | POA: Diagnosis not present

## 2015-04-03 DIAGNOSIS — Z950 Presence of cardiac pacemaker: Secondary | ICD-10-CM | POA: Diagnosis not present

## 2015-04-03 DIAGNOSIS — I495 Sick sinus syndrome: Secondary | ICD-10-CM

## 2015-04-03 DIAGNOSIS — R0602 Shortness of breath: Secondary | ICD-10-CM | POA: Diagnosis not present

## 2015-04-03 LAB — CUP PACEART INCLINIC DEVICE CHECK
Battery Remaining Longevity: 106 mo
Battery Voltage: 2.77 V
Brady Statistic RA Percent Paced: 45 %
Date Time Interrogation Session: 20160725131025
Lead Channel Impedance Value: 0 Ohm
Lead Channel Impedance Value: 616 Ohm
Lead Channel Pacing Threshold Amplitude: 0.5 V
Lead Channel Pacing Threshold Pulse Width: 0.4 ms
MDC IDC MSMT BATTERY IMPEDANCE: 303 Ohm
MDC IDC MSMT LEADCHNL RA SENSING INTR AMPL: 2.8 mV
MDC IDC SET LEADCHNL RA PACING AMPLITUDE: 2 V

## 2015-04-03 NOTE — Patient Instructions (Addendum)
Medication Instructions:  Your physician recommends that you continue on your current medications as directed. Please refer to the Current Medication list given to you today.   Labwork: None ordered  Testing/Procedures: Your physician has recommended that you have a cardiopulmonary stress test (CPX). CPX testing is a non-invasive measurement of heart and lung function. It replaces a traditional treadmill stress test. This type of test provides a tremendous amount of information that relates not only to your present condition but also for future outcomes. This test combines measurements of you ventilation, respiratory gas exchange in the lungs, electrocardiogram (EKG), blood pressure and physical response before, during, and following an exercise protocol.----in Sept    Follow-Up: Your physician wants you to follow-up in: 12 months with Chanetta Marshall, NP You will receive a reminder letter in the mail two months in advance. If you don't receive a letter, please call our office to schedule the follow-up appointment.   Any Other Special Instructions Will Be Listed Below (If Applicable).

## 2015-04-04 NOTE — Progress Notes (Signed)
Primary Care Physician: Nyoka Cowden, MD Primary Cardiology:  [facility] (Dr Riccardo Dubin) Primary EP:  [facility] (Dr Georgiann Hahn)   John Myers is a 29 y.o. male with a h/o D-TGA s/p Senning procedure and sinus node dysfunction s/p MDT AAI pacemaker at [facility] by Dr Geanie Kenning who presents today for device follow-up  He seems to be doing reasonably well.  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  His SOB remains.  He feels that at night, his heart rate is "too fast". .   Today, he denies symptoms of chest pain, shortness of breath, orthopnea, PND, lower extremity edema, syncope, or neurologic sequela.   The patient is tolerating medications without difficulties and is otherwise without complaint today.   Past Medical History  Diagnosis Date  . TGA (transposition of great arteries)     surgical correction at [facility] at 35 months of age  . Sick sinus syndrome 2014    s/p PPM  . Allergic rhinitis   . Depression   . Finger injury   . Premature ventricular contractions   . Right ventricular dysfunction    Past Surgical History  Procedure Laterality Date  . Pacemaker insertion  03/08/13    MDT Adapta SR (atrial) PPM implanted by Dr Geanie Kenning at [facility] for sick sinus syndrome  . Transposition of great vessels repair      Senning procedure performed at 47 months of age at [facility].    No current outpatient prescriptions on file.   No current facility-administered medications for this visit.    No Known Allergies  History   Social History  . Marital Status: Single    Spouse Name: N/A  . Number of Children: N/A  . Years of Education: N/A   Occupational History  . Not on file.   Social History Main Topics  . Smoking status: Never Smoker   . Smokeless tobacco: Never Used  . Alcohol Use: Yes  . Drug Use: No  . Sexual Activity: Not on file   Other Topics Concern  . 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 Hypertension    . Hyperlipidemia    . CAD      ROS- All systems are reviewed and negative except as per the HPI above  Physical Exam: Filed Vitals:   04/03/15 1152  BP: 101/80  Pulse: 70  Height: 5\' 11"  (1.803 m)  Weight: 74.118 kg (163 lb 6.4 oz)    GEN- The patient is well appearing, alert and oriented x 3 today.   Head- normocephalic, atraumatic Eyes-  Sclera clear, conjunctiva pink Ears- hearing intact Oropharynx- clear Neck- supple,   Lungs- Clear to ausculation bilaterally, normal work of breathing Heart- Regular rate and rhythm, 2/6 crescendo/ decrescendo murmur of the LSB GI- soft, NT, ND, + BS Extremities- no clubbing, cyanosis, or edema MS- no significant deformity or atrophy Skin- pacemaker pocket is well healed, sternotomy is well healed Psych- euthymic mood, full affect Neuro- strength and sensation are intact  Device interrogation today is reviewed  Assessment and Plan:  1. Sick sinus syndrome/  Normal pacemaker function He is 55% A paced.  Histograms appear reasonable.  He  is programmed AAI.  I have turned sleep heart rate from 70 to 50 bpm. I have considered turning rate response on but he is reluctant to do so today.  I will order CPX at this time to evaluate his exercise limitation.  IF he has any degree of chronotropic incompetence, he may be more willing to allow rate response to be adjusted.  2. Fatigue/ decreased exercise tolerance As above He will continue to follow closely with [place] cardiology He has asked that I follow his device going forward.  Unfortunately, he is clear in his decision to decline remote monitoring. Hopefully he will comply with CPX.  Return to see EP NP in 1 year

## 2015-05-18 ENCOUNTER — Encounter (HOSPITAL_COMMUNITY): Payer: BLUE CROSS/BLUE SHIELD

## 2015-05-18 ENCOUNTER — Telehealth: Payer: Self-pay | Admitting: Internal Medicine

## 2015-05-18 NOTE — Telephone Encounter (Signed)
Pt scheduled for CPX today to evaluate chronotropic incompetence/ need for rate response. Pt did not have stress test today, he reports that he has a "stressful job" and is "tired" and "does not need a stress test". When trying to explain the purpose of stress test to evaluate heart rates with exercise he says that his problem is that his heart skips at night and he cannot sleep due to this. He is upset that his cardiologist with [facility] tell him the problem is his pacemaker and when he has his pacemaker checked he is told "the problem is his heart, not the pacemaker." Pt declines remote follow up. Will route to Dr. Rayann Heman for further recommendations.

## 2015-05-18 NOTE — Telephone Encounter (Signed)
New Message      Pt calling stating that he thinks his pacemaker needs to be turned up to prevent it from skipping so much. Pt states he does not want to be contacted by a nurse for anything and only wants to talk to Dr. Rayann Heman directly. Please call back and advise.

## 2015-05-20 NOTE — Telephone Encounter (Signed)
Unable to further adjust his pacemaker for rate response if he is not complaint with ordered tests to assess.

## 2015-05-23 NOTE — Telephone Encounter (Signed)
Pt aware that we are unable to further adjust his pacemaker since he did not have the stress test last week. I advised him to call our office back if he elects to reschedule CPX to adjust rate response parameters. He says that he probably will not have the stress test done because he is seeing another doctor. He reports that he has already left a message with medical records to have records sent to his new physician.

## 2015-06-27 IMAGING — CR DG FINGER INDEX 2+V*L*
1 series · 1 of 1 positions shown · non-contrast
Comparison: None.

CLINICAL DATA: Laceration and swelling.

EXAM:
LEFT INDEX FINGER 2+V

[view not recorded]
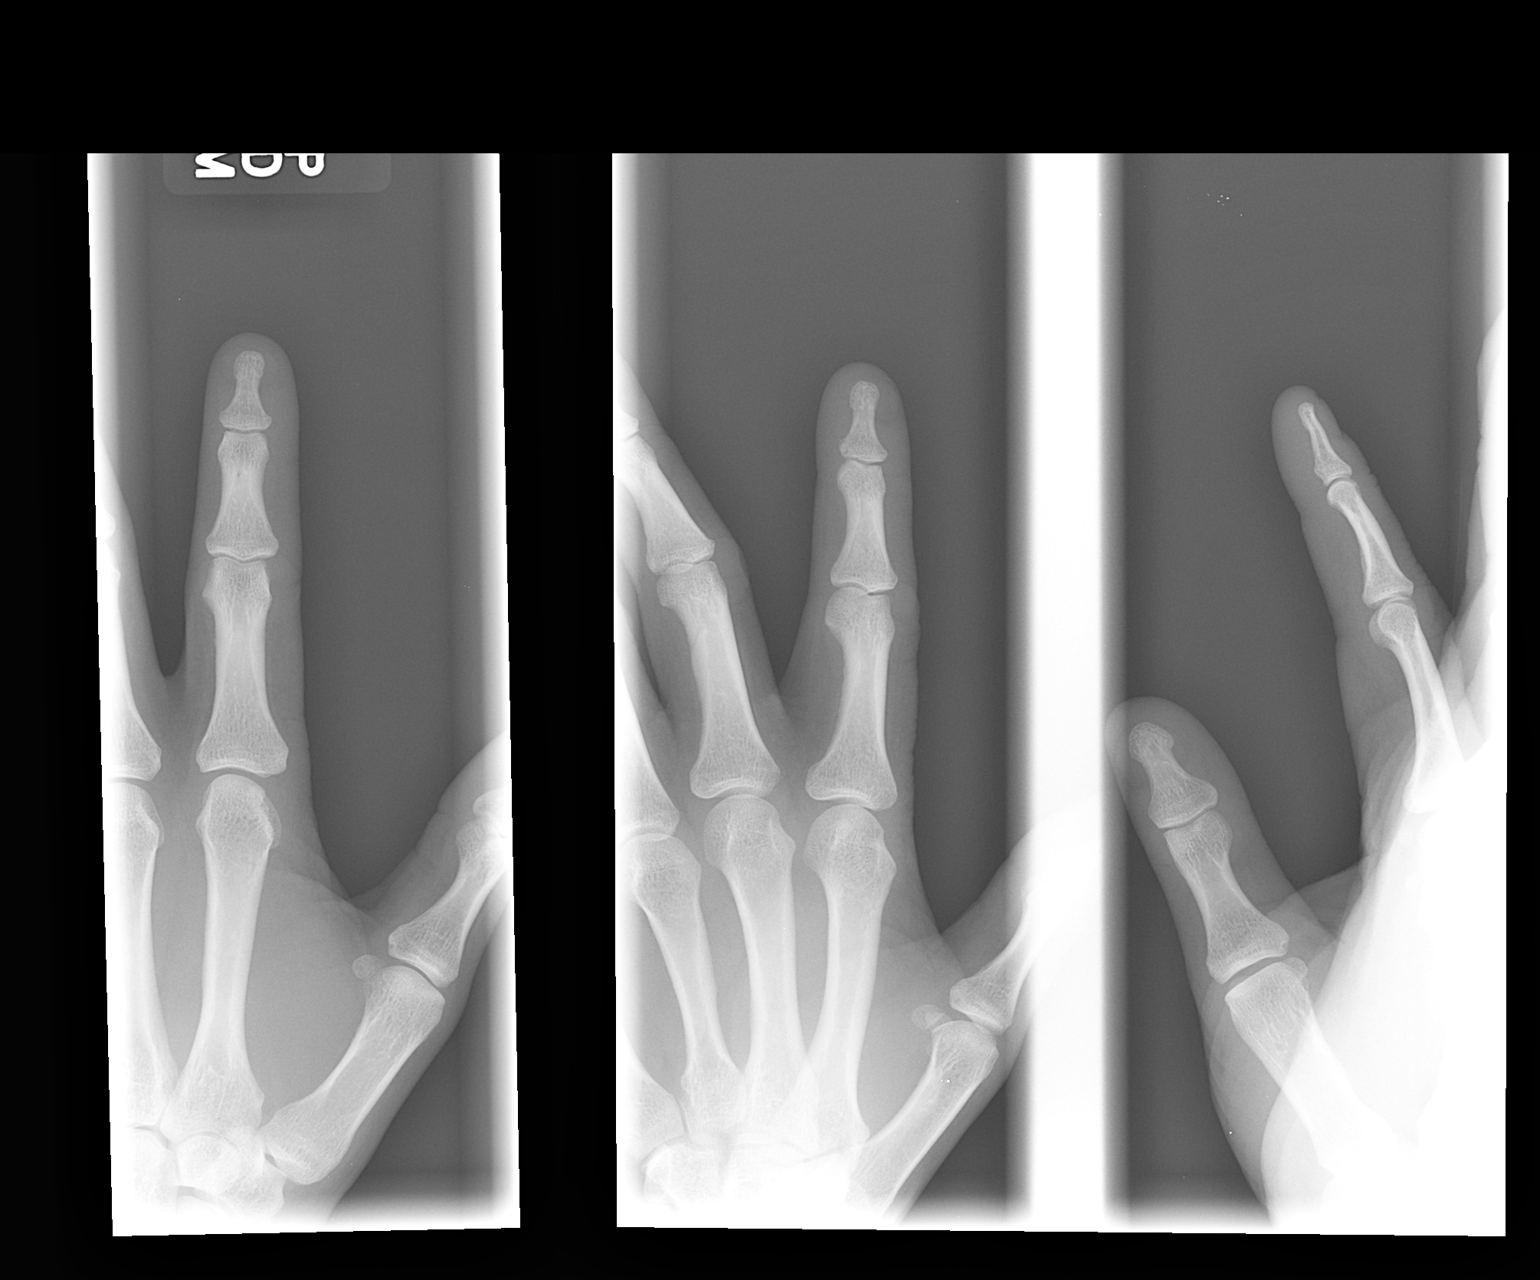

[1 of 1 positions shown; findings below may reference images not displayed]

FINDINGS: No osseous abnormality. No radiodense foreign body.
IMPRESSION: Normal exam.

## 2015-10-17 ENCOUNTER — Encounter: Payer: Self-pay | Admitting: Internal Medicine

## 2015-10-17 ENCOUNTER — Ambulatory Visit (INDEPENDENT_AMBULATORY_CARE_PROVIDER_SITE_OTHER): Payer: BLUE CROSS/BLUE SHIELD | Admitting: Internal Medicine

## 2015-10-17 ENCOUNTER — Telehealth: Payer: Self-pay | Admitting: Internal Medicine

## 2015-10-17 VITALS — BP 120/70 | HR 75 | Temp 99.0°F | Resp 20 | Ht 71.0 in | Wt 165.0 lb

## 2015-10-17 DIAGNOSIS — Q203 Discordant ventriculoarterial connection: Secondary | ICD-10-CM

## 2015-10-17 DIAGNOSIS — J029 Acute pharyngitis, unspecified: Secondary | ICD-10-CM | POA: Diagnosis not present

## 2015-10-17 LAB — POCT RAPID STREP A (OFFICE): Rapid Strep A Screen: NEGATIVE

## 2015-10-17 NOTE — Telephone Encounter (Signed)
Spoke to pt, due to dad not on DPR. Told pt your dad called asking if you were checked for Mono and Dr.K did not do a Mono test because your symptoms are not even 24  hours and the test would be negative. Told pt if symptoms continue and you get worse he would check for Mono then. Pt verbalized understanding.

## 2015-10-17 NOTE — Patient Instructions (Addendum)
Take 400-600 mg of ibuprofen ( Advil, Motrin) with food every 4 to 6 hours as needed for pain relief or control of fever  Use lozenges for sore throat pain  Report any new or worsening symptoms  HOME CARE INSTRUCTIONS  Drink enough water and fluids to keep your urine clear or pale yellow.  Only take over-the-counter or prescription medicines as directed by your health care provider:  If you are prescribed antibiotics, make sure you finish them even if you start to feel better.  Do not take aspirin.  Get lots of rest.  Gargle with 8 oz of salt water ( tsp of salt per 1 qt of water) as often as every 1-2 hours to soothe your throat.  Throat lozenges (if you are not at risk for choking) or sprays may be used to soothe your throat. SEEK MEDICAL CARE IF:  You have large, tender lumps in your neck.  You have a rash.  You cough up green, yellow-brown, or bloody spit. SEEK IMMEDIATE MEDICAL CARE IF:  Your neck becomes stiff.  You drool or are unable to swallow liquids.  You vomit or are unable to keep medicines or liquids down.  You have severe pain that does not go away with the use of recommended medicines.  You have trouble breathing (not caused by a stuffy nose).

## 2015-10-17 NOTE — Progress Notes (Signed)
Subjective:    Patient ID: John Myers, male    DOB: 10-26-1985, 30 y.o.   MRN: [id number]  HPI  30 year old patient who has a history of remote surgery for TGA.  He presents with a one-day history of low back pain, headache, sore throat and general sense of unwellness.  He has had a low-grade fever.  No history of strep. Due to low back pain.  He was concerned about shingles.  He has had shingles on 2 prior occasions involving the right flank area  Past Medical History  Diagnosis Date  . TGA (transposition of great arteries)     surgical correction at [facility] at 60 months of age  . Sick sinus syndrome ([place]) 2014    s/p PPM  . Allergic rhinitis   . Depression   . Finger injury   . Premature ventricular contractions   . Right ventricular dysfunction     Social History   Social History  . Marital Status: Single    Spouse Name: N/A  . Number of Children: N/A  . Years of Education: N/A   Occupational History  . Not on file.   Social History Main Topics  . Smoking status: Never Smoker   . Smokeless tobacco: Never Used  . Alcohol Use: Yes  . Drug Use: No  . Sexual Activity: Not on file   Other Topics Concern  . 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 Pacemaker insertion  03/08/13    MDT Adapta SR (atrial) PPM implanted by Dr Geanie Kenning at [facility] for sick sinus syndrome  . Transposition of great vessels repair      Senning procedure performed at 52 months of age at [facility].    Family History  Problem Relation Age of Onset  . Hypertension    . Hyperlipidemia    . CAD      No Known Allergies  No current outpatient prescriptions on file prior to visit.   No current facility-administered medications on file prior to visit.    BP 120/70 mmHg  Pulse 75  Temp(Src) 99 F  (37.2 C) (Oral)  Resp 20  Ht 5\' 11"  (1.803 m)  Wt 165 lb (74.844 kg)  BMI 23.02 kg/m2  SpO2 98%     Review of Systems  Constitutional: Positive for fever, activity change, appetite change and fatigue. Negative for chills.  HENT: Positive for sore throat. Negative for congestion, dental problem, ear pain, hearing loss, tinnitus, trouble swallowing and voice change.   Eyes: Negative for pain, discharge and visual disturbance.  Respiratory: Negative for cough, chest tightness, wheezing and stridor.   Cardiovascular: Negative for chest pain, palpitations and leg swelling.  Gastrointestinal: Negative for nausea, vomiting, abdominal pain, diarrhea, constipation, blood in stool and abdominal distention.  Genitourinary: Negative for urgency, hematuria, flank pain, discharge, difficulty urinating and genital sores.  Musculoskeletal: Positive for back pain. Negative for myalgias, joint swelling, arthralgias, gait problem and neck stiffness.  Skin: Negative for rash.  Neurological: Positive for headaches. Negative for dizziness, syncope, speech difficulty, weakness and numbness.  Hematological: Negative for adenopathy. Does not bruise/bleed easily.  Psychiatric/Behavioral: Negative for behavioral problems and dysphoric mood. The patient is not nervous/anxious.        Objective:  Physical Exam  Constitutional: He is oriented to person, place, and time. He appears well-developed.  HENT:  Head: Normocephalic.  Right Ear: External ear normal.  Left Ear: External ear normal.  Pharyngitis without exudate  Eyes: Conjunctivae and EOM are normal.  Neck: Normal range of motion.  Cardiovascular: Normal rate, regular rhythm and normal heart sounds.    Grade 2/6 systolic murmur lives at the left sternal border Loud single second heart sound  Pulmonary/Chest: Breath sounds normal. No respiratory distress. He has no wheezes. He has no rales.  Abdominal: Bowel sounds are normal.  Musculoskeletal:  Normal range of motion. He exhibits no edema or tenderness.  Lymphadenopathy:    He has no cervical adenopathy.  Neurological: He is alert and oriented to person, place, and time.  Psychiatric: He has a normal mood and affect. His behavior is normal.          Assessment & Plan:   Pharyngitis, low-grade fever, myalgias.  Rule out group A beta hemolytic strep.  Rapid strep screen will be obtained.  Will continue ibuprofen

## 2015-10-17 NOTE — Telephone Encounter (Signed)
Pt was not checked for Mono will discuss with Dr.K.

## 2015-10-17 NOTE — Telephone Encounter (Signed)
Pt was just seen for sore throat and strep was negative. Pt dad would like to know if md check for mono

## 2015-10-17 NOTE — Progress Notes (Signed)
Pre visit review using our clinic review tool, if applicable. No additional management support is needed unless otherwise documented below in the visit note. 

## 2015-10-19 ENCOUNTER — Encounter: Payer: Self-pay | Admitting: Family Medicine

## 2015-10-19 ENCOUNTER — Ambulatory Visit (INDEPENDENT_AMBULATORY_CARE_PROVIDER_SITE_OTHER): Payer: BLUE CROSS/BLUE SHIELD | Admitting: Family Medicine

## 2015-10-19 VITALS — BP 124/84 | HR 77 | Temp 98.1°F | Wt 166.1 lb

## 2015-10-19 DIAGNOSIS — Q203 Discordant ventriculoarterial connection: Secondary | ICD-10-CM

## 2015-10-19 DIAGNOSIS — J029 Acute pharyngitis, unspecified: Secondary | ICD-10-CM | POA: Diagnosis not present

## 2015-10-19 LAB — POCT MONO (EPSTEIN BARR VIRUS): MONO, POC: NEGATIVE

## 2015-10-19 NOTE — Progress Notes (Addendum)
Subjective:    Patient ID: John Myers, male    DOB: 1986/03/29, 30 y.o.   MRN: [id number]  HPI  Mr. Cambron is a 30 year old male who presents today with a a 4 day history of congestion, sore throat, maxillary sinus pressure, and rhinitis with brown/green nasal drainage. He reports having chills at the beginning of his symptoms with a low grade fever which has since resolved. Treatment with ibuprofen has provided moderate benefit for symptoms. He denies nausea/vomiting, headache, and diarrhea. He is a nonsmoker with a history of remote surgery for TGA. Patient has requested to be tested for mono. His rapid strep screening 2 days ago was negative.   Review of Systems  Constitutional: Positive for fatigue. Negative for fever and chills.  HENT: Positive for congestion, postnasal drip, rhinorrhea, sinus pressure and sore throat. Negative for ear pain.   Respiratory: Negative for cough, chest tightness, shortness of breath and wheezing.   Cardiovascular: Negative for chest pain and palpitations.  Gastrointestinal: Negative for nausea, vomiting and diarrhea.  Musculoskeletal: Negative for arthralgias.  Skin: Negative for color change and pallor.  Neurological: Negative for dizziness and headaches.   Past Medical History  Diagnosis Date  . TGA (transposition of great arteries)     surgical correction at [facility] at 47 months of age  . Sick sinus syndrome ([place]) 2014    s/p PPM  . Allergic rhinitis   . Depression   . Finger injury   . Premature ventricular contractions   . Right ventricular dysfunction     Social History   Social History  . Marital Status: Single    Spouse Name: N/A  . Number of Children: N/A  . Years of Education: N/A   Occupational History  . Not on file.   Social History Main Topics  . Smoking status: Never Smoker   . Smokeless tobacco: Never Used  . Alcohol Use: Yes  . Drug Use: No  . Sexual Activity: Not on file   Other Topics Concern  . 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 Pacemaker insertion  03/08/13    MDT Adapta SR (atrial) PPM implanted by Dr Geanie Kenning at [facility] for sick sinus syndrome  . Transposition of great vessels repair      Senning procedure performed at 71 months of age at [facility].    Family History  Problem Relation Age of Onset  . Hypertension    . Hyperlipidemia    . CAD      No Known Allergies  No current outpatient prescriptions on file prior to visit.   No current facility-administered medications on file prior to visit.    BP 124/84 mmHg  Pulse 77  Temp(Src) 98.1 F (36.7 C) (Oral)  Wt 166 lb 1.6 oz (75.342 kg)  SpO2 98%      Objective:   Physical Exam  Constitutional: He is oriented to person, place, and time. He appears well-developed and well-nourished.  HENT:  Right Ear: Tympanic membrane normal.  Left Ear: Tympanic membrane normal.  Mouth/Throat: Mucous membranes are normal.   Pharyngitis, no exudate noted. Maxillary pressure and slight tenderness noted upon palpation  Eyes: Pupils are equal, round, and reactive to light.  Cardiovascular: Normal rate  and regular rhythm.   Systolic murmur heard over left lower sternal border  Pulmonary/Chest: Effort normal and breath sounds normal. He has no wheezes. He has no rales.  Abdominal: Soft. There is no tenderness.  Lymphadenopathy:    He has no cervical adenopathy.  Neurological: He is alert and oriented to person, place, and time.  Skin: Skin is warm and dry.  Psychiatric: He has a normal mood and affect. His behavior is normal.      Assessment & Plan:  1. Sore throat  Pharyngitis, no exudate noted. Rapid strep screen negative 2 days ago. No cervical adenopathy or tonsillar exudates noted on exam. Patient requested mono screening. CBC and mono  screen today. Advised patient to continue support measures of drinking fluids, rest, and ibuprofen as needed. Results will be called to patient within 1 week or sooner if needed. - Mononucleosis screen - CBC w/Diff  Advised patient to RTC to follow up with PCP if symptoms do not improve in 3-4 days, become worse such as increased sore throat, headache, nausea, or he develops a fever >101. He agreed with plan and voiced understanding.

## 2015-10-19 NOTE — Patient Instructions (Addendum)
Please go to lab for blood work before leaving clinic. Lab results will be called to you within 1 week or sooner if needed.  Please continue supportive measures of drinking fluids, rest, and ibuprofen for discomfort. Return to clinic if symptoms worsen, do not improve in 3-4 days, or you develop a fever >101.  Upper Respiratory Infection, Adult Most upper respiratory infections (URIs) are a viral infection of the air passages leading to the lungs. A URI affects the nose, throat, and upper air passages. The most common type of URI is nasopharyngitis and is typically referred to as "the common cold." URIs run their course and usually go away on their own. Most of the time, a URI does not require medical attention, but sometimes a bacterial infection in the upper airways can follow a viral infection. This is called a secondary infection. Sinus and middle ear infections are common types of secondary upper respiratory infections. Bacterial pneumonia can also complicate a URI. A URI can worsen asthma and chronic obstructive pulmonary disease (COPD). Sometimes, these complications can require emergency medical care and may be life threatening.  CAUSES Almost all URIs are caused by viruses. A virus is a type of germ and can spread from one person to another.  RISKS FACTORS You may be at risk for a URI if:   You smoke.   You have chronic heart or lung disease.  You have a weakened defense (immune) system.   You are very young or very old.   You have nasal allergies or asthma.  You work in crowded or poorly ventilated areas.  You work in health care facilities or schools. SIGNS AND SYMPTOMS  Symptoms typically develop 2-3 days after you come in contact with a cold virus. Most viral URIs last 7-10 days. However, viral URIs from the influenza virus (flu virus) can last 14-18 days and are typically more severe. Symptoms may include:   Runny or stuffy (congested) nose.   Sneezing.   Cough.    Sore throat.   Headache.   Fatigue.   Fever.   Loss of appetite.   Pain in your forehead, behind your eyes, and over your cheekbones (sinus pain).  Muscle aches.  DIAGNOSIS  Your health care provider may diagnose a URI by:  Physical exam.  Tests to check that your symptoms are not due to another condition such as:  Strep throat.  Sinusitis.  Pneumonia.  Asthma. TREATMENT  A URI goes away on its own with time. It cannot be cured with medicines, but medicines may be prescribed or recommended to relieve symptoms. Medicines may help:  Reduce your fever.  Reduce your cough.  Relieve nasal congestion. HOME CARE INSTRUCTIONS   Take medicines only as directed by your health care provider.   Gargle warm saltwater or take cough drops to comfort your throat as directed by your health care provider.  Use a warm mist humidifier or inhale steam from a shower to increase air moisture. This may make it easier to breathe.  Drink enough fluid to keep your urine clear or pale yellow.   Eat soups and other clear broths and maintain good nutrition.   Rest as needed.   Return to work when your temperature has returned to normal or as your health care provider advises. You may need to stay home longer to avoid infecting others. You can also use a face mask and careful hand washing to prevent spread of the virus.  Increase the usage of your inhaler if  you have asthma.   Do not use any tobacco products, including cigarettes, chewing tobacco, or electronic cigarettes. If you need help quitting, ask your health care provider. PREVENTION  The best way to protect yourself from getting a cold is to practice good hygiene.   Avoid oral or hand contact with people with cold symptoms.   Wash your hands often if contact occurs.  There is no clear evidence that vitamin C, vitamin E, echinacea, or exercise reduces the chance of developing a cold. However, it is always  recommended to get plenty of rest, exercise, and practice good nutrition.  SEEK MEDICAL CARE IF:   You are getting worse rather than better.   Your symptoms are not controlled by medicine.   You have chills.  You have worsening shortness of breath.  You have brown or red mucus.  You have yellow or brown nasal discharge.  You have pain in your face, especially when you bend forward.  You have a fever.  You have swollen neck glands.  You have pain while swallowing.  You have white areas in the back of your throat. SEEK IMMEDIATE MEDICAL CARE IF:   You have severe or persistent:  Headache.  Ear pain.  Sinus pain.  Chest pain.  You have chronic lung disease and any of the following:  Wheezing.  Prolonged cough.  Coughing up blood.  A change in your usual mucus.  You have a stiff neck.  You have changes in your:  Vision.  Hearing.  Thinking.  Mood. MAKE SURE YOU:   Understand these instructions.  Will watch your condition.  Will get help right away if you are not doing well or get worse.   This information is not intended to replace advice given to you by your health care provider. Make sure you discuss any questions you have with your health care provider.   Document Released: 02/19/2001 Document Revised: 01/10/2015 Document Reviewed: 12/01/2013 Elsevier Interactive Patient Education [employer].

## 2015-10-19 NOTE — Progress Notes (Signed)
Pre visit review using our clinic review tool, if applicable. No additional management support is needed unless otherwise documented below in the visit note. 

## 2015-10-19 NOTE — Addendum Note (Signed)
Addended by: Gari Crown D on: 10/19/2015 05:10 PM   Modules accepted: Orders

## 2015-10-20 LAB — MONONUCLEOSIS SCREEN: Mono Screen: NEGATIVE

## 2015-10-20 NOTE — Addendum Note (Signed)
Addended by: Delano Metz A on: 10/20/2015 09:29 AM   Modules accepted: Miquel Dunn

## 2015-10-22 LAB — CBC WITH DIFFERENTIAL/PLATELET
BASOS ABS: 0.1 10*3/uL (ref 0.0–0.1)
Basophils Relative: 0.6 % (ref 0.0–3.0)
EOS ABS: 0.4 10*3/uL (ref 0.0–0.7)
Eosinophils Relative: 4.6 % (ref 0.0–5.0)
HCT: 49.9 % (ref 39.0–52.0)
HEMOGLOBIN: 15.3 g/dL (ref 13.0–17.0)
Lymphocytes Relative: 26.1 % (ref 12.0–46.0)
Lymphs Abs: 2.1 10*3/uL (ref 0.7–4.0)
MCHC: 30.7 g/dL (ref 30.0–36.0)
MCV: 95.6 fl (ref 78.0–100.0)
MONO ABS: 1 10*3/uL (ref 0.1–1.0)
Monocytes Relative: 12.3 % — ABNORMAL HIGH (ref 3.0–12.0)
Neutro Abs: 4.6 10*3/uL (ref 1.4–7.7)
Neutrophils Relative %: 56.3 % (ref 43.0–77.0)
Platelets: 187 10*3/uL (ref 150.0–400.0)
RBC: 5.22 Mil/uL (ref 4.22–5.81)
RDW: 13.4 % (ref 11.5–15.5)
WBC: 8.1 10*3/uL (ref 4.0–10.5)

## 2016-02-12 DIAGNOSIS — S43432D Superior glenoid labrum lesion of left shoulder, subsequent encounter: Secondary | ICD-10-CM | POA: Diagnosis not present

## 2016-02-12 DIAGNOSIS — M7542 Impingement syndrome of left shoulder: Secondary | ICD-10-CM | POA: Diagnosis not present

## 2016-02-15 ENCOUNTER — Other Ambulatory Visit: Payer: Self-pay | Admitting: Orthopedic Surgery

## 2016-03-04 DIAGNOSIS — M7542 Impingement syndrome of left shoulder: Secondary | ICD-10-CM | POA: Diagnosis not present

## 2016-03-04 DIAGNOSIS — S43432D Superior glenoid labrum lesion of left shoulder, subsequent encounter: Secondary | ICD-10-CM | POA: Diagnosis not present

## 2016-03-21 ENCOUNTER — Encounter (HOSPITAL_COMMUNITY): Payer: Self-pay

## 2016-03-21 ENCOUNTER — Encounter (HOSPITAL_COMMUNITY)
Admission: RE | Admit: 2016-03-21 | Discharge: 2016-03-21 | Disposition: A | Discharge status: Home / Self Care | Payer: BLUE CROSS/BLUE SHIELD | Source: Ambulatory Visit | Attending: Specialist | Admitting: Specialist

## 2016-03-21 DIAGNOSIS — Z01812 Encounter for preprocedural laboratory examination: Secondary | ICD-10-CM | POA: Diagnosis not present

## 2016-03-21 DIAGNOSIS — Z01810 Encounter for preprocedural cardiovascular examination: Secondary | ICD-10-CM | POA: Diagnosis not present

## 2016-03-21 LAB — CBC
HCT: 42.6 % (ref 39.0–52.0)
Hemoglobin: 14.8 g/dL (ref 13.0–17.0)
MCH: 30 pg (ref 26.0–34.0)
MCHC: 34.7 g/dL (ref 30.0–36.0)
MCV: 86.2 fL (ref 78.0–100.0)
Platelets: 147 K/uL — ABNORMAL LOW (ref 150–400)
RBC: 4.94 MIL/uL (ref 4.22–5.81)
RDW: 12.3 % (ref 11.5–15.5)
WBC: 7 K/uL (ref 4.0–10.5)

## 2016-03-21 NOTE — Pre-Procedure Instructions (Signed)
I consulted with Dr. Marcell Barlow about patient's pacemaker and gave him the management orders from Dr. Jeraldine Loots, pt's cardiologist. Dr. Marcell Barlow reviewed pt's records from [facility].

## 2016-03-21 NOTE — Pre-Procedure Instructions (Signed)
CBC results routed to Dr. Theda Sers

## 2016-03-21 NOTE — Patient Instructions (Signed)
BOLIVAR FIGURA  03/21/2016   Your procedure is scheduled on: 03/26/16  Report to [facility] Main  Entrance take [place]  elevators to 3rd floor to [place] at 12 noon.  Call this number if you have problems the morning of surgery [phone]   Remember: ONLY 1 PERSON MAY GO WITH YOU TO SHORT STAY TO GET  READY MORNING OF YOUR SURGERY.  Do not eat food After Midnight Monday  Clear liquids ok until 8am on Tuesday   Take these medicines the morning of surgery with A SIP OF WATER: none                                You may not have any metal on your body including hair pins and              piercings  Do not wear jewelry, lotions, powders or deodorant.              Do not shave  48 hours prior to surgery.              Men may shave face and neck.   Do not bring valuables to the hospital. [place].  Contacts, dentures or bridgework may not be worn into surgery.  Leave suitcase in the car. After surgery it may be brought to your room.     Patients discharged the day of surgery will not be allowed to drive home.  Name and phone number of your driver:Erin                _____________________________________________________________________             [facility] - Preparing for Surgery Before surgery, you can play an important role.  Because skin is not sterile, your skin needs to be as free of germs as possible.  You can reduce the number of germs on your skin by washing with CHG (chlorahexidine gluconate) soap before surgery.  CHG is an antiseptic cleaner which kills germs and bonds with the skin to continue killing germs even after washing. Please DO NOT use if you have an allergy to CHG or antibacterial soaps.  If your skin becomes reddened/irritated stop using the CHG and inform your nurse when you arrive at Short Stay. Do not shave (including legs and underarms) for at least 48 hours prior to the  first CHG shower.  You may shave your face/neck. Please follow these instructions carefully:  1.  Shower with CHG Soap the night before surgery and the  morning of Surgery.  2.  If you choose to wash your hair, wash your hair first as usual with your  normal  shampoo.  3.  After you shampoo, rinse your hair and body thoroughly to remove the  shampoo.                           4.  Use CHG as you would any other liquid soap.  You can apply chg directly  to the skin and wash                       Gently with a scrungie or  clean washcloth.  5.  Apply the CHG Soap to your body ONLY FROM THE NECK DOWN.   Do not use on face/ open                           Wound or open sores. Avoid contact with eyes, ears mouth and genitals (private parts).                       Wash face,  Genitals (private parts) with your normal soap.             6.  Wash thoroughly, paying special attention to the area where your surgery  will be performed.  7.  Thoroughly rinse your body with warm water from the neck down.  8.  DO NOT shower/wash with your normal soap after using and rinsing off  the CHG Soap.                9.  Pat yourself dry with a clean towel.            10.  Wear clean pajamas.            11.  Place clean sheets on your bed the night of your first shower and do not  sleep with pets. Day of Surgery : Do not apply any lotions/deodorants the morning of surgery.  Please wear clean clothes to the hospital/surgery center.  FAILURE TO FOLLOW THESE INSTRUCTIONS MAY RESULT IN THE CANCELLATION OF YOUR SURGERY PATIENT SIGNATURE_________________________________  NURSE SIGNATURE__________________________________  ________________________________________________________________________    CLEAR LIQUID DIET   Foods Allowed                                                                     Foods Excluded  Coffee and tea, regular and decaf                             liquids that you cannot  Plain Jell-O in any  flavor                                             see through such as: Fruit ices (not with fruit pulp)                                     milk, soups, orange juice  Iced Popsicles                                    All solid food Carbonated beverages, regular and diet                                    Cranberry, grape and apple juices Sports drinks like Gatorade Lightly seasoned clear broth or consume(fat free) Sugar, honey syrup  Sample Menu Breakfast  Lunch                                     Supper Cranberry juice                    Beef broth                            Chicken broth Jell-O                                     Grape juice                           Apple juice Coffee or tea                        Jell-O                                      Popsicle                                                Coffee or tea                        Coffee or tea  _____________________________________________________________________

## 2016-03-26 ENCOUNTER — Observation Stay (HOSPITAL_COMMUNITY)
Admission: RE | Admit: 2016-03-26 | Discharge: 2016-03-27 | Disposition: A | Discharge status: Home / Self Care | Payer: BLUE CROSS/BLUE SHIELD | Source: Ambulatory Visit | Attending: Specialist | Admitting: Specialist

## 2016-03-26 ENCOUNTER — Ambulatory Visit (HOSPITAL_COMMUNITY): Payer: BLUE CROSS/BLUE SHIELD | Admitting: Anesthesiology

## 2016-03-26 ENCOUNTER — Encounter (HOSPITAL_COMMUNITY)
Admission: RE | Disposition: A | Discharge status: Home / Self Care | Payer: Self-pay | Source: Ambulatory Visit | Attending: Specialist

## 2016-03-26 ENCOUNTER — Encounter (HOSPITAL_COMMUNITY): Payer: Self-pay | Admitting: *Deleted

## 2016-03-26 DIAGNOSIS — Z9889 Other specified postprocedural states: Secondary | ICD-10-CM

## 2016-03-26 DIAGNOSIS — J309 Allergic rhinitis, unspecified: Secondary | ICD-10-CM | POA: Insufficient documentation

## 2016-03-26 DIAGNOSIS — S43432A Superior glenoid labrum lesion of left shoulder, initial encounter: Secondary | ICD-10-CM | POA: Diagnosis not present

## 2016-03-26 DIAGNOSIS — Z950 Presence of cardiac pacemaker: Secondary | ICD-10-CM | POA: Insufficient documentation

## 2016-03-26 DIAGNOSIS — M25512 Pain in left shoulder: Secondary | ICD-10-CM | POA: Diagnosis not present

## 2016-03-26 DIAGNOSIS — R002 Palpitations: Secondary | ICD-10-CM | POA: Insufficient documentation

## 2016-03-26 DIAGNOSIS — M7542 Impingement syndrome of left shoulder: Principal | ICD-10-CM | POA: Insufficient documentation

## 2016-03-26 DIAGNOSIS — G8918 Other acute postprocedural pain: Secondary | ICD-10-CM | POA: Diagnosis not present

## 2016-03-26 DIAGNOSIS — F419 Anxiety disorder, unspecified: Secondary | ICD-10-CM | POA: Diagnosis not present

## 2016-03-26 DIAGNOSIS — M24112 Other articular cartilage disorders, left shoulder: Secondary | ICD-10-CM | POA: Diagnosis not present

## 2016-03-26 DIAGNOSIS — Z8774 Personal history of (corrected) congenital malformations of heart and circulatory system: Secondary | ICD-10-CM | POA: Insufficient documentation

## 2016-03-26 HISTORY — DX: Gilbert syndrome: E80.4

## 2016-03-26 HISTORY — PX: SHOULDER ARTHROSCOPY WITH CAPSULORRHAPHY: SHX6454

## 2016-03-26 HISTORY — PX: SUBACROMIAL DECOMPRESSION: SHX5174

## 2016-03-26 SURGERY — SHOULDER ATHROSCOPY WITH CAPSULORRHAPHY
Anesthesia: General | Site: Shoulder | Laterality: Left

## 2016-03-26 MED ORDER — MIDAZOLAM HCL 2 MG/2ML IJ SOLN
INTRAMUSCULAR | Status: AC
  Filled 2016-03-26: qty 2

## 2016-03-26 MED ORDER — POVIDONE-IODINE 7.5 % EX SOLN: Freq: Once | CUTANEOUS | Status: DC

## 2016-03-26 MED ORDER — OXYCODONE HCL 5 MG PO TABS
5.0000 mg | ORAL_TABLET | ORAL | Status: DC | PRN
  Administered 2016-03-26 – 2016-03-27 (×2): 10 mg via ORAL
  Filled 2016-03-26 (×2): qty 2

## 2016-03-26 MED ORDER — ONDANSETRON HCL 4 MG/2ML IJ SOLN
INTRAMUSCULAR | Status: DC | PRN
  Administered 2016-03-26: 4 mg via INTRAVENOUS

## 2016-03-26 MED ORDER — BUPIVACAINE-EPINEPHRINE 0.5% -1:200000 IJ SOLN
INTRAMUSCULAR | Status: AC
  Filled 2016-03-26: qty 1

## 2016-03-26 MED ORDER — DEXAMETHASONE SODIUM PHOSPHATE 10 MG/ML IJ SOLN
INTRAMUSCULAR | Status: DC | PRN
  Administered 2016-03-26: 10 mg via INTRAVENOUS

## 2016-03-26 MED ORDER — POLYETHYLENE GLYCOL 3350 17 G PO PACK: 17.0000 g | PACK | Freq: Every day | ORAL | Status: DC | PRN

## 2016-03-26 MED ORDER — ONDANSETRON HCL 4 MG/2ML IJ SOLN
INTRAMUSCULAR | Status: AC
  Filled 2016-03-26: qty 2

## 2016-03-26 MED ORDER — CEFAZOLIN IN D5W 1 GM/50ML IV SOLN
1.0000 g | Freq: Four times a day (QID) | INTRAVENOUS | Status: AC
  Administered 2016-03-26 – 2016-03-27 (×2): 1 g via INTRAVENOUS
  Filled 2016-03-26 (×2): qty 50

## 2016-03-26 MED ORDER — DIPHENHYDRAMINE HCL 25 MG PO CAPS: 25.0000 mg | ORAL_CAPSULE | Freq: Four times a day (QID) | ORAL | Status: DC | PRN

## 2016-03-26 MED ORDER — ACETAMINOPHEN 10 MG/ML IV SOLN
INTRAVENOUS | Status: AC
  Filled 2016-03-26: qty 100

## 2016-03-26 MED ORDER — BUPIVACAINE-EPINEPHRINE 0.25% -1:200000 IJ SOLN
INTRAMUSCULAR | Status: AC
  Filled 2016-03-26: qty 1

## 2016-03-26 MED ORDER — LIDOCAINE HCL (CARDIAC) 20 MG/ML IV SOLN
INTRAVENOUS | Status: AC
  Filled 2016-03-26: qty 5

## 2016-03-26 MED ORDER — MENTHOL 3 MG MT LOZG: 1.0000 | LOZENGE | OROMUCOSAL | Status: DC | PRN

## 2016-03-26 MED ORDER — ACETAMINOPHEN 325 MG PO TABS: 650.0000 mg | ORAL_TABLET | Freq: Four times a day (QID) | ORAL | Status: DC | PRN

## 2016-03-26 MED ORDER — ACETAMINOPHEN 10 MG/ML IV SOLN
INTRAVENOUS | Status: DC | PRN
  Administered 2016-03-26: 1000 mg via INTRAVENOUS

## 2016-03-26 MED ORDER — METOCLOPRAMIDE HCL 10 MG PO TABS: 5.0000 mg | ORAL_TABLET | Freq: Three times a day (TID) | ORAL | Status: DC | PRN

## 2016-03-26 MED ORDER — OXYCODONE-ACETAMINOPHEN 5-325 MG PO TABS: 1.0000 | ORAL_TABLET | ORAL | Status: DC | PRN

## 2016-03-26 MED ORDER — EPHEDRINE SULFATE 50 MG/ML IJ SOLN
INTRAMUSCULAR | Status: AC
  Filled 2016-03-26: qty 1

## 2016-03-26 MED ORDER — METHOCARBAMOL 500 MG PO TABS: 500.0000 mg | ORAL_TABLET | Freq: Four times a day (QID) | ORAL | Status: DC | PRN

## 2016-03-26 MED ORDER — METOCLOPRAMIDE HCL 5 MG/ML IJ SOLN: 5.0000 mg | Freq: Three times a day (TID) | INTRAMUSCULAR | Status: DC | PRN

## 2016-03-26 MED ORDER — DOCUSATE SODIUM 100 MG PO CAPS: 100.0000 mg | ORAL_CAPSULE | Freq: Two times a day (BID) | ORAL | Status: DC

## 2016-03-26 MED ORDER — MIDAZOLAM HCL 5 MG/5ML IJ SOLN
INTRAMUSCULAR | Status: DC | PRN
  Administered 2016-03-26 (×2): 1 mg via INTRAVENOUS

## 2016-03-26 MED ORDER — PROPOFOL 10 MG/ML IV BOLUS
INTRAVENOUS | Status: AC
  Filled 2016-03-26: qty 20

## 2016-03-26 MED ORDER — ROCURONIUM BROMIDE 100 MG/10ML IV SOLN
INTRAVENOUS | Status: AC
  Filled 2016-03-26: qty 1

## 2016-03-26 MED ORDER — FENTANYL CITRATE (PF) 250 MCG/5ML IJ SOLN
INTRAMUSCULAR | Status: AC
  Filled 2016-03-26: qty 5

## 2016-03-26 MED ORDER — BUPIVACAINE-EPINEPHRINE (PF) 0.5% -1:200000 IJ SOLN
INTRAMUSCULAR | Status: AC
  Filled 2016-03-26: qty 30

## 2016-03-26 MED ORDER — GLYCOPYRROLATE 0.2 MG/ML IJ SOLN
INTRAMUSCULAR | Status: AC
  Filled 2016-03-26: qty 1

## 2016-03-26 MED ORDER — ROCURONIUM BROMIDE 100 MG/10ML IV SOLN
INTRAVENOUS | Status: DC | PRN
  Administered 2016-03-26: 40 mg via INTRAVENOUS

## 2016-03-26 MED ORDER — CEFAZOLIN SODIUM-DEXTROSE 2-4 GM/100ML-% IV SOLN
INTRAVENOUS | Status: AC
  Filled 2016-03-26: qty 100

## 2016-03-26 MED ORDER — BUPIVACAINE-EPINEPHRINE (PF) 0.5% -1:200000 IJ SOLN
INTRAMUSCULAR | Status: DC | PRN
  Administered 2016-03-26: 25 mL via PERINEURAL

## 2016-03-26 MED ORDER — PROMETHAZINE HCL 25 MG/ML IJ SOLN: 6.2500 mg | INTRAMUSCULAR | Status: DC | PRN

## 2016-03-26 MED ORDER — SUCCINYLCHOLINE CHLORIDE 20 MG/ML IJ SOLN
INTRAMUSCULAR | Status: DC | PRN
  Administered 2016-03-26: 100 mg via INTRAVENOUS

## 2016-03-26 MED ORDER — HYDROMORPHONE HCL 1 MG/ML IJ SOLN: 0.2500 mg | INTRAMUSCULAR | Status: DC | PRN

## 2016-03-26 MED ORDER — SODIUM CHLORIDE 0.9 % IJ SOLN
INTRAMUSCULAR | Status: AC
  Filled 2016-03-26: qty 50

## 2016-03-26 MED ORDER — PHENYLEPHRINE HCL 10 MG/ML IJ SOLN
INTRAMUSCULAR | Status: AC
  Filled 2016-03-26: qty 1

## 2016-03-26 MED ORDER — SUGAMMADEX SODIUM 200 MG/2ML IV SOLN
INTRAVENOUS | Status: AC
  Filled 2016-03-26: qty 2

## 2016-03-26 MED ORDER — ALUM & MAG HYDROXIDE-SIMETH 200-200-20 MG/5ML PO SUSP: 30.0000 mL | ORAL | Status: DC | PRN

## 2016-03-26 MED ORDER — SODIUM CHLORIDE 0.9 % IJ SOLN
INTRAMUSCULAR | Status: DC | PRN
  Administered 2016-03-26: 8 mL

## 2016-03-26 MED ORDER — LIDOCAINE HCL (CARDIAC) 20 MG/ML IV SOLN
INTRAVENOUS | Status: DC | PRN
  Administered 2016-03-26: 75 mg via INTRAVENOUS

## 2016-03-26 MED ORDER — FENTANYL CITRATE (PF) 100 MCG/2ML IJ SOLN: 100.0000 ug | Freq: Once | INTRAMUSCULAR | Status: DC

## 2016-03-26 MED ORDER — BUPIVACAINE HCL (PF) 0.25 % IJ SOLN
INTRAMUSCULAR | Status: AC
  Filled 2016-03-26: qty 30

## 2016-03-26 MED ORDER — CEFAZOLIN SODIUM-DEXTROSE 2-4 GM/100ML-% IV SOLN
2.0000 g | INTRAVENOUS | Status: AC
  Administered 2016-03-26: 2 g via INTRAVENOUS

## 2016-03-26 MED ORDER — FENTANYL CITRATE (PF) 100 MCG/2ML IJ SOLN
INTRAMUSCULAR | Status: DC | PRN
  Administered 2016-03-26 (×2): 25 ug via INTRAVENOUS
  Administered 2016-03-26: 100 ug via INTRAVENOUS
  Administered 2016-03-26 (×2): 25 ug via INTRAVENOUS

## 2016-03-26 MED ORDER — DEXAMETHASONE SODIUM PHOSPHATE 10 MG/ML IJ SOLN
INTRAMUSCULAR | Status: AC
  Filled 2016-03-26: qty 1

## 2016-03-26 MED ORDER — EPINEPHRINE HCL 1 MG/ML IJ SOLN
INTRAMUSCULAR | Status: DC | PRN
  Administered 2016-03-26: .15 mg

## 2016-03-26 MED ORDER — ATROPINE SULFATE 0.4 MG/ML IJ SOLN
INTRAMUSCULAR | Status: AC
  Filled 2016-03-26: qty 1

## 2016-03-26 MED ORDER — SUGAMMADEX SODIUM 200 MG/2ML IV SOLN
INTRAVENOUS | Status: DC | PRN
  Administered 2016-03-26: 150 mg via INTRAVENOUS

## 2016-03-26 MED ORDER — HYDROMORPHONE HCL 1 MG/ML IJ SOLN
0.5000 mg | INTRAMUSCULAR | Status: DC | PRN
  Filled 2016-03-26: qty 1

## 2016-03-26 MED ORDER — EPINEPHRINE HCL 1 MG/ML IJ SOLN
INTRAMUSCULAR | Status: AC
  Filled 2016-03-26: qty 1

## 2016-03-26 MED ORDER — ALBUMIN HUMAN 5 % IV SOLN
INTRAVENOUS | Status: AC
  Filled 2016-03-26: qty 250

## 2016-03-26 MED ORDER — MIDAZOLAM HCL 2 MG/2ML IJ SOLN: 2.0000 mg | Freq: Once | INTRAMUSCULAR | Status: DC

## 2016-03-26 MED ORDER — BUPIVACAINE HCL (PF) 0.25 % IJ SOLN
INTRAMUSCULAR | Status: DC | PRN
  Administered 2016-03-26: 20 mL

## 2016-03-26 MED ORDER — METHOCARBAMOL 1000 MG/10ML IJ SOLN: 500.0000 mg | Freq: Four times a day (QID) | INTRAVENOUS | Status: DC | PRN

## 2016-03-26 MED ORDER — ALBUMIN HUMAN 5 % IV SOLN
INTRAVENOUS | Status: DC | PRN
  Administered 2016-03-26: 15:00:00 via INTRAVENOUS

## 2016-03-26 MED ORDER — PHENOL 1.4 % MT LIQD: 1.0000 | OROMUCOSAL | Status: DC | PRN

## 2016-03-26 MED ORDER — LACTATED RINGERS IV SOLN
INTRAVENOUS | Status: DC
  Administered 2016-03-26 (×2): via INTRAVENOUS

## 2016-03-26 MED ORDER — METHOCARBAMOL 500 MG PO TABS: 500.0000 mg | ORAL_TABLET | Freq: Three times a day (TID) | ORAL | Status: DC | PRN

## 2016-03-26 MED ORDER — ACETAMINOPHEN 650 MG RE SUPP: 650.0000 mg | Freq: Four times a day (QID) | RECTAL | Status: DC | PRN

## 2016-03-26 MED ORDER — FENTANYL CITRATE (PF) 100 MCG/2ML IJ SOLN
INTRAMUSCULAR | Status: AC
  Filled 2016-03-26: qty 2

## 2016-03-26 MED ORDER — PROPOFOL 10 MG/ML IV BOLUS
INTRAVENOUS | Status: DC | PRN
  Administered 2016-03-26: 200 mg via INTRAVENOUS

## 2016-03-26 SURGICAL SUPPLY — 51 items
APL SKNCLS STERI-STRIP NONHPOA (GAUZE/BANDAGES/DRESSINGS) ×1
BAG SPEC THK2 15X12 ZIP CLS (MISCELLANEOUS) ×1
BAG ZIPLOCK 12X15 (MISCELLANEOUS) ×2 IMPLANT
BENZOIN TINCTURE PRP APPL 2/3 (GAUZE/BANDAGES/DRESSINGS) ×2 IMPLANT
BLADE CUDA GRT WHITE 3.5 (BLADE) ×2 IMPLANT
BLADE CUDA SHAVER 3.5 (BLADE) ×2 IMPLANT
BLADE GREAT WHITE 4.2 (BLADE) ×2 IMPLANT
BLADE SURG SZ11 CARB STEEL (BLADE) ×2 IMPLANT
BOOTIES KNEE HIGH SLOAN (MISCELLANEOUS) ×4 IMPLANT
BUR OVAL 6.0 (BURR) ×1 IMPLANT
CANNULA 5.75X7 CRYSTAL CLEAR (CANNULA) ×1 IMPLANT
DRAPE ORTHO 2.5IN SPLIT 77X108 (DRAPES) IMPLANT
DRAPE ORTHO SPLIT 77X108 STRL (DRAPES)
DRAPE POUCH INSTRU U-SHP 10X18 (DRAPES) ×2 IMPLANT
DRAPE STERI 35X30 U-POUCH (DRAPES) ×2 IMPLANT
DRAPE SURG 17X11 SM STRL (DRAPES) ×2 IMPLANT
DRAPE U-SHAPE 47X51 STRL (DRAPES) ×2 IMPLANT
DRSG PAD ABDOMINAL 8X10 ST (GAUZE/BANDAGES/DRESSINGS) ×2 IMPLANT
ELECT REM PT RETURN 9FT ADLT (ELECTROSURGICAL) ×2
ELECTRODE REM PT RTRN 9FT ADLT (ELECTROSURGICAL) ×1 IMPLANT
FIBERSTICK 2 (SUTURE) ×4 IMPLANT
GAUZE SPONGE 4X4 12PLY STRL (GAUZE/BANDAGES/DRESSINGS) ×1 IMPLANT
GAUZE XEROFORM 5X9 LF (GAUZE/BANDAGES/DRESSINGS) ×1 IMPLANT
GLOVE BIOGEL PI IND STRL 8 (GLOVE) ×1 IMPLANT
GLOVE BIOGEL PI INDICATOR 8 (GLOVE) ×1
GLOVE ORTHO TXT STRL SZ7.5 (GLOVE) ×2 IMPLANT
GLOVE SURG SS PI 7.5 STRL IVOR (GLOVE) ×2 IMPLANT
GOWN STRL REUS W/TWL XL LVL3 (GOWN DISPOSABLE) ×2 IMPLANT
KIT BASIN OR (CUSTOM PROCEDURE TRAY) ×2 IMPLANT
KIT SHOULDER TRACTION (DRAPES) ×2 IMPLANT
MANIFOLD NEPTUNE II (INSTRUMENTS) ×2 IMPLANT
NDL HYPO 20X1 EYE RM 11 (NEEDLE) ×1 IMPLANT
NDL SPNL 18GX3.5 QUINCKE PK (NEEDLE) ×1 IMPLANT
NEEDLE HYPO 20X1 EYE RM 11 (NEEDLE) ×2 IMPLANT
NEEDLE HYPO 22GX1.5 SAFETY (NEEDLE) ×2 IMPLANT
NEEDLE SPNL 18GX3.5 QUINCKE PK (NEEDLE) ×2 IMPLANT
NS IRRIG 1000ML POUR BTL (IV SOLUTION) ×1 IMPLANT
PACK SHOULDER (CUSTOM PROCEDURE TRAY) ×2 IMPLANT
POSITIONER SURGICAL ARM (MISCELLANEOUS) ×2 IMPLANT
SLING ARM IMMOBILIZER LRG (SOFTGOODS) IMPLANT
SLING ARM IMMOBILIZER MED (SOFTGOODS) ×1 IMPLANT
SUT ETHILON 3 0 PS 1 (SUTURE) ×2 IMPLANT
SUT FIBERWIRE #2 38 T-5 BLUE (SUTURE)
SUT PDS AB 0 CT 36 (SUTURE) IMPLANT
SUTURE FIBERWR #2 38 T-5 BLUE (SUTURE) IMPLANT
SYR 20CC LL (SYRINGE) ×2 IMPLANT
SYR 30ML LL (SYRINGE) ×2 IMPLANT
TOWEL OR 17X26 10 PK STRL BLUE (TOWEL DISPOSABLE) ×4 IMPLANT
TUBING CONNECTING 10 (TUBING) ×2 IMPLANT
WAND HAND CNTRL MULTIVAC 90 (MISCELLANEOUS) ×2 IMPLANT
WATER STERILE IRR 1500ML POUR (IV SOLUTION) ×1 IMPLANT

## 2016-03-26 NOTE — Anesthesia Procedure Notes (Addendum)
Anesthesia Regional Block:  Interscalene brachial plexus block  Pre-Anesthetic Checklist: ,, timeout performed, Correct Patient, Correct Site, Correct Laterality, Correct Procedure, Correct Position, site marked, Risks and benefits discussed,  Surgical consent,  Pre-op evaluation,  At surgeon's request and post-op pain management  Laterality: Upper and Left  Prep: chloraprep and alcohol swabs       Needles:  Injection technique: Single-shot  Needle Type: Echogenic Stimulator Needle     Needle Length: 9cm 9 cm Needle Gauge: 22 and 22 G  Needle insertion depth: 3 cm   Additional Needles:  Procedures: ultrasound guided (picture in chart) and nerve stimulator Interscalene brachial plexus block  Nerve Stimulator or Paresthesia:  Response: Twitch elicited, 0.5 mA, 0.3 ms,   Additional Responses:   Narrative:  Start time: 03/26/2016 2:40 PM End time: 03/26/2016 2:54 PM Injection made incrementally with aspirations every 5 mL.  Performed by: Personally  Anesthesiologist: MASSAGEE, TERRY  Additional Notes: Block assessed prior to start of surgery   Procedure Name: Intubation Date/Time: 03/26/2016 3:14 PM Performed by: Enrigue Catena E Pre-anesthesia Checklist: Patient identified, Emergency Drugs available, Suction available and Patient being monitored Patient Re-evaluated:Patient Re-evaluated prior to inductionOxygen Delivery Method: Circle system utilized Preoxygenation: Pre-oxygenation with 100% oxygen Intubation Type: IV induction Ventilation: Mask ventilation without difficulty Laryngoscope Size: Mac and 4 Tube type: Oral Tube size: 8.0 mm Number of attempts: 1 Airway Equipment and Method: Stylet and Oral airway Placement Confirmation: ETT inserted through vocal cords under direct vision,  positive ETCO2 and breath sounds checked- equal and bilateral Secured at: 22 cm Tube secured with: Tape Dental Injury: Teeth and Oropharynx as per pre-operative assessment

## 2016-03-26 NOTE — Anesthesia Postprocedure Evaluation (Signed)
Anesthesia Post Note  Patient: John Myers  Procedure(s) Performed: Procedure(s) (LRB): LEFT SHOULDER ATHROSCOPY WITH DEBRIDEMENT, chromoplasty, and bursectomy. (Left) LEFT SUBACROMIAL DECOMPRESSION (Left)  Patient location during evaluation: PACU Anesthesia Type: General and Regional Level of consciousness: awake and alert Pain management: pain level controlled Vital Signs Assessment: post-procedure vital signs reviewed and stable Respiratory status: spontaneous breathing, nonlabored ventilation, respiratory function stable and patient connected to nasal cannula oxygen Cardiovascular status: blood pressure returned to baseline and stable Postop Assessment: no signs of nausea or vomiting Anesthetic complications: no    Last Vitals:  Filed Vitals:   03/26/16 1715 03/26/16 1730  BP: 120/73 115/76  Pulse: 80 80  Temp:    Resp: 12 13    Last Pain:  Filed Vitals:   03/26/16 1735  PainSc: Asleep                 Benyamin Jeff,JAMES TERRILL

## 2016-03-26 NOTE — H&P (Signed)
John Myers is an 30 y.o. male.   Chief Complaint: Left shoulder pain. HPI: Patient presents with joint discomfort that had been persistent for several months now. Despite conservative treatments, his discomfort has not improved. Imaging was obtained. Other conservative and surgical treatments were discussed in detail. Patient wishes to proceed with surgery as consented. Denies SOB, CP, or calf pain. No Fever, chills, or nausea/ vomiting. Hx of Sick sinus and TGA. Implantable pacemaker. Folowed by [facility] cardiology.    Past Medical History  Diagnosis Date  . TGA (transposition of great arteries)     surgical correction at [facility] at 60 months of age  . Sick sinus syndrome ([place]) 2014    s/p PPM  . Allergic rhinitis   . Depression   . Finger injury   . Premature ventricular contractions   . Right ventricular dysfunction   . Rosanna Randy syndrome 2005    Past Surgical History  Procedure Laterality Date  . Pacemaker insertion  03/08/13    MDT Adapta SR (atrial) PPM implanted by Dr Geanie Kenning at [facility] for sick sinus syndrome  . Transposition of great vessels repair      Senning procedure performed at 42 months of age at [facility].    Family History  Problem Relation Age of Onset  . Hypertension    . Hyperlipidemia    . CAD     Social History:  reports that he has never smoked. He has never used smokeless tobacco. He reports that he does not drink alcohol or use illicit drugs.  Allergies: No Known Allergies  Medications Prior to Admission  Medication Sig Dispense Refill  . diphenhydrAMINE (BENADRYL) 25 MG tablet Take 25 mg by mouth every 6 (six) hours as needed for allergies.      No results found for this or any previous visit (from the past 48 hour(s)). No results found.  Review of Systems  Constitutional: Negative.   Eyes: Negative.   Respiratory: Negative.   Cardiovascular: Negative.        Hx of sick sinus syndrome. Pacemaker implantation.  Gastrointestinal: Negative.    Musculoskeletal: Positive for joint pain.  Skin: Negative.   Neurological: Negative.   Endo/Heme/Allergies: Negative.   Psychiatric/Behavioral: Negative.     Blood pressure 131/74, pulse 70, temperature 98.2 F (36.8 C), temperature source Oral, resp. rate 16, SpO2 99 %. Physical Exam  Constitutional: He is oriented to person, place, and time. He appears well-developed.  HENT:  Head: Normocephalic.  Eyes: Pupils are equal, round, and reactive to light.  Neck: Normal range of motion.  Cardiovascular: Normal rate, regular rhythm and intact distal pulses.   Respiratory: Effort normal.  GI: Soft.  Genitourinary:  Deferred  Musculoskeletal:  Left shoulder pain with ROM. Positive impingement signs.LUE grossly n/v intact. No AC joint tenderness.  Neurological: He is alert and oriented to person, place, and time.  Skin: Skin is warm and dry.  Psychiatric: His behavior is normal.     Assessment/Plan Left shoulder pain: Left shoulder scope as consented at [facility] Overnight obs on telemetry  [employer] to manage pacemaker as recommended (change to asynchronous mode @70bpm  prior to case and changed back to original settings post-op.) Follow instructions Take medications as directed F/u in office in week F/u with [place] cardiology as drected  Lajean Manes, PA-C 03/26/2016, 12:22 PM

## 2016-03-26 NOTE — Anesthesia Preprocedure Evaluation (Addendum)
Anesthesia Evaluation  Patient identified by MRN, date of birth, ID band Patient awake    Reviewed: Allergy & Precautions, NPO status , Patient's Chart, lab work & pertinent test results  History of Anesthesia Complications Negative for: history of anesthetic complications  Airway Mallampati: I  TM Distance: >3 FB     Dental  (+) Teeth Intact   Pulmonary neg pulmonary ROS,    breath sounds clear to auscultation       Cardiovascular negative cardio ROS   Rhythm:Regular Rate:Normal + Systolic murmurs TGA from birth. Senning procedure at 3 months . And pacemaker dependent  He does have some fatigue c exertion, This is recent in last year  Abn EKG noted,paced     Neuro/Psych Depression negative neurological ROS     GI/Hepatic negative GI ROS, Neg liver ROS,   Endo/Other  negative endocrine ROS  Renal/GU negative Renal ROS     Musculoskeletal   Abdominal   Peds  Hematology negative hematology ROS (+)   Anesthesia Other Findings   Reproductive/Obstetrics                             Anesthesia Physical Anesthesia Plan  ASA: II  Anesthesia Plan: General   Post-op Pain Management: GA combined w/ Regional for post-op pain   Induction: Intravenous  Airway Management Planned: Oral ETT  Additional Equipment:   Intra-op Plan:   Post-operative Plan: Extubation in OR  Informed Consent: I have reviewed the patients History and Physical, chart, labs and discussed the procedure including the risks, benefits and alternatives for the proposed anesthesia with the patient or authorized representative who has indicated his/her understanding and acceptance.     Plan Discussed with:   Anesthesia Plan Comments:         Anesthesia Quick Evaluation

## 2016-03-26 NOTE — Progress Notes (Signed)
Assisted Dr. Massagee with left, ultrasound guided, interscalene  block. Side rails up, monitors on throughout procedure. See vital signs in flow sheet. Tolerated Procedure well. 

## 2016-03-26 NOTE — Transfer of Care (Signed)
Immediate Anesthesia Transfer of Care Note  Patient: John Myers  Procedure(s) Performed: Procedure(s) with comments: LEFT SHOULDER ATHROSCOPY WITH DEBRIDEMENT, chromoplasty, and bursectomy. (Left) - ANESTHESIA: GENERAL/INTERSCALENE BLOCK LEFT SUBACROMIAL DECOMPRESSION (Left)  Patient Location: PACU  Anesthesia Type:General  Level of Consciousness: awake, alert , oriented and patient cooperative  Airway & Oxygen Therapy: Patient Spontanous Breathing and Patient connected to face mask oxygen  Post-op Assessment: Report given to RN, Post -op Vital signs reviewed and stable and Patient moving all extremities X 4  Post vital signs: stable  Last Vitals:  Filed Vitals:   03/26/16 1452 03/26/16 1626  BP:    Pulse: 84 96  Temp:  36.4 C  Resp: 9     Last Pain:  Filed Vitals:   03/26/16 1629  PainSc: 0-No pain      Patients Stated Pain Goal: 5 ([id number])  Complications: No apparent anesthesia complications

## 2016-03-26 NOTE — Op Note (Signed)
Preop diagnosis left shoulder possible labral tear, rotator cuff impingement syndrome Postoperative diagnosis left shoulder rotator cuff impingement syndrome Procedure #1 left shoulder glenohumeral diagnostic arthroscopy #2 left shoulder arthroscopic bursectomy acromioplasty and CA ligament release, debridement Surgeon Hart Robinsons M.D. Asst. Jerilynn Mages PA-C Anesthesia interscalene block general Estimated blood loss minimal Drains none Complications none Disposition to PACU  Operative details Patient was counseled in the holding area. The defibrillator company representative arrived and properly programmed the [facility] this the interscalene block was administered by the anesthesiologist. IV antibiotics are given. Taken to the operating room in supine position under general anesthesia. PAS stockings were applied patient was gently turned to a right lateral decubitus position probably padded. The shoulder examined full range of motion stable. Prepped with DuraPrep and draped into a sterile fashion. We utilized the Arthrex sterile shoulder holder and place the patient at 4 of abduction 10 of full flexion. Again Gershon Mussel out was done posterior portal was crated scope was placed into the shoulder joint. Immediately identifiable was normal intra-articular anatomy including the articular cartilage labrum circumferential biceps, synovium capsule rotator cuff, glenohumeral ligaments, instability. Antral portal was made an outside in technique the rotator cuff interval to supplement the arthroscopy and to probe the different structures as noted above. Also took the nerve foot probe the biceps labrum into the joint a bit to make sure it was okay and it was fine. Shoulder was Irrigated Arthroscopic Root Was Placed into the Subacromial Space. Another Lateral Portal Was Established Neurovascular Structures Were Protected Including the Axillary Nerve.  The Subacromial Space Showed a Tremendous Amount of Thick Bursa. Utilizing [employer] and [place] Was Performed and Scuffing of the Rough Rotator Cuff underneath the Coracoacromial Arch in the CA Ligament. This Confirmed the Diagnosis of Extrinsic Impingement. At This Time Cautery System Was Utilized to Take down the Periosteum CA Ligament and Bur Was Placed Posteriorly an Anterior Inferior Lateral Acromioplasty Was Performed Convert into a Flat Acromion Morphology. Rotator Cuff on the Bursal Side Was Intact except for very mild fraying. She joint was left alone. No no other abnormalities noted. Arthroscopic root was removed after pain and hemostasis. Taken out of traction. Normal pulses in the wrist at in the case. Suture placed into the 3 portal sites 20 mL of 0.25% Sensorcaine plain was then injected the shoulder joint at the end of the case after being confirmed by anesthesia. Sterile dressings applied posterior sling turned supine awakened and taken from the operating room to PACU in stable condition. To help with patient positioning prepping draping technical assistance throughout the entire case Mr. Jerilynn Mages PA-C assistance was needed he will be kept overnight for observation monitored for [employer] rep will make the patient in the root in the PACU and repeat and reset his patient's initial settings.

## 2016-03-27 ENCOUNTER — Telehealth: Payer: Self-pay | Admitting: Cardiovascular Disease

## 2016-03-27 ENCOUNTER — Encounter (HOSPITAL_COMMUNITY): Payer: Self-pay | Admitting: Specialist

## 2016-03-27 DIAGNOSIS — Z950 Presence of cardiac pacemaker: Secondary | ICD-10-CM | POA: Diagnosis not present

## 2016-03-27 DIAGNOSIS — J309 Allergic rhinitis, unspecified: Secondary | ICD-10-CM | POA: Diagnosis not present

## 2016-03-27 DIAGNOSIS — M7542 Impingement syndrome of left shoulder: Secondary | ICD-10-CM | POA: Diagnosis not present

## 2016-03-27 MED ORDER — ALPRAZOLAM 0.25 MG PO TABS
0.2500 mg | ORAL_TABLET | Freq: Once | ORAL | Status: AC
  Administered 2016-03-27: 0.25 mg via ORAL
  Filled 2016-03-27: qty 1

## 2016-03-27 NOTE — Progress Notes (Signed)
Pt. Requesting a copy of the doctors notes regarding his surgery. He needs a copy for his claim for his cardiologist at [facility]. Advised pt. That I cannot print out the doctor's notes and that a note will be passed on to day shift so that his surgeon can print out a copy of the surgery notes for him. Pt. Understands.

## 2016-03-27 NOTE — Progress Notes (Signed)
Pt. C/o heart palpitations and not being able to sleep d/t his heart racing. Pt. Has a pacemaker and states its programmed to be in the 50's. Pt's heart rate at 70-80's. EKG obtained along with VS: HR 79, BP 133/76, O2 100%. Pt. Not in distress. Orthopedic surgeon on call paged. PA returned call was explained the situation. Order of one time Xanax given, and suggested cardiologist consult was done. Pt. Stated he really needed to see a cardiologist now and have the HR turned down. Will continue to monitor pt. Closely.

## 2016-03-27 NOTE — Progress Notes (Signed)
Received a page about patient status change at 4 am. Patient reports palpations and anxiousness. BP stable and 100% on room air. Patient reports he is overriding his pacer and the rate should be 50, but it is 70-80 bpm.Possible related to block wearing off. Xannax ordered. Cardiology (Dr.Paul Corotto ) called  for consult and recommended integration of the pacemaker from [employer]. [employer] called and will evaluated called by cardiology. Patient is stable and will monitor closely.

## 2016-03-27 NOTE — Addendum Note (Signed)
Addendum  created 03/27/16 1006 by Lollie Sails, CRNA   Modules edited: Charges VN

## 2016-03-27 NOTE — Discharge Summary (Signed)
Physician Discharge Summary  Patient ID: John Myers MRN: [id number] DOB/AGE: 30-18-1987 30 y.o.  Admit date: 03/26/2016 Discharge date: 03/27/2016  Admission Diagnoses:  Discharge Diagnoses:  Active Problems:   S/P shoulder surgery   Discharged Condition: good  Hospital Course:  John Myers is a 30 y.o. who was admitted to [facility]. They were brought to the operating room on 03/26/2016 and underwent Procedure(s): LEFT SHOULDER ATHROSCOPY WITH DEBRIDEMENT, chromoplasty, and bursectomy. LEFT SUBACROMIAL DECOMPRESSION.  Patient tolerated the procedure well and was later transferred to the recovery room and then to the orthopaedic floor for postoperative care.  They were given PO and IV analgesics for pain control following their surgery.  They were given 24 hours of postoperative antibiotics of  Anti-infectives    Start     Dose/Rate Route Frequency Ordered Stop   03/26/16 2100  ceFAZolin (ANCEF) IVPB 1 g/50 mL premix     1 g 100 mL/hr over 30 Minutes Intravenous Every 6 hours 03/26/16 1828 03/27/16 0302   03/26/16 1200  ceFAZolin (ANCEF) IVPB 2g/100 mL premix     2 g 200 mL/hr over 30 Minutes Intravenous On call to O.R. 03/26/16 1141 03/26/16 1508     and started on DVT prophylaxis in the form of SCDs.     Discharge planning consulted to help with postop disposition and equipment needs.  Patient had a fair night on the evening of surgery and started to get up OOB. Palpations in the night. Cardiology called and recommendations carried out. Dressing was with normal limits.  The patient had progressed with therapy and meeting their goals. Patient was seen in rounds and was ready to go home.  Consults: cardiology  Significant Diagnostic Studies: n/a  Treatments: telemetry  Discharge Exam: Blood pressure 133/76, pulse 80, temperature 97.6 F (36.4 C), temperature source Oral, resp. rate 16, height 6' (1.829 m), weight 75.297 kg (166 lb), SpO2 100 %. Alert and  oriented x3. Paced, Lungs clear, BS x4. L shoulder dressing C/D/I. No DVT signs. No signs of infection or compartment syndrome. LUE grossly neurovascularly intact. Sling in place.   Disposition: Final discharge disposition not confirmed  Discharge Instructions    Call MD / Call 911    Complete by:  As directed   If you experience chest pain or shortness of breath, CALL 911 and be transported to the hospital emergency room.  If you develope a fever above 101 F, pus (white drainage) or increased drainage or redness at the wound, or calf pain, call your surgeon's office.     Constipation Prevention    Complete by:  As directed   Drink plenty of fluids.  Prune juice may be helpful.  You may use a stool softener, such as Colace (over the counter) 100 mg twice a day.  Use MiraLax (over the counter) for constipation as needed.     Diet - low sodium heart healthy    Complete by:  As directed      Discharge instructions    Complete by:  As directed   DISCHARGE INSTRUCTIONS: ________________________________________________________________________________  POST-OPERATIVE INSTRUCTIONS SHOULDER ARTHROSCOPY  Hart Robinsons, MD [facility] [address] [place], [place]  [address] [phone]      Ice pack on shoulder. Prescriptions may have been provided to take as needed for the pain.  To prevent constipation while on pain medications, take Colace 100mg . twice a day while taking narcotics.  If you become constipated, use Miralax 17 gm once  a day with plenty of fluids.   May remove bandages and shower on day 3.  Apply band-aids and Neosporin to suture.  May get out of sling when comfortable and start gentle range of motion as pain allows. Elevate operative shoulder and elbow on pillow. Exercise your fingers/elbow to help reduce swelling. Report to your doctor should any of the following situations occur: Swelling of your fingers Inability to wiggle your  fingers Coldness, turning pale or blue of your fingers Loss of sensation, numbness or tingling of your fingers Unusual smell or odor from under the dressing Excessive bleeding or drainage from the surgical site Pain not relieved by medications your doctor has prescribed for you Call the office to schedule a follow-up appointment in 7-10 days.     Increase activity slowly as tolerated    Complete by:  As directed             Medication List    TAKE these medications        diphenhydrAMINE 25 MG tablet  Commonly known as:  BENADRYL  Take 25 mg by mouth every 6 (six) hours as needed for allergies.     methocarbamol 500 MG tablet  Commonly known as:  ROBAXIN  Take 1 tablet (500 mg total) by mouth every 8 (eight) hours as needed for muscle spasms.     oxyCODONE-acetaminophen 5-325 MG tablet  Commonly known as:  ROXICET  Take 1-2 tablets by mouth every 4 (four) hours as needed for severe pain.        F/u with [place] cardiology in the next 1-2 weeks or as directed.    SignedLajean Manes 03/27/2016, 7:38 AM

## 2016-03-27 NOTE — Progress Notes (Signed)
Subjective: 1 Day Post-Op Procedure(s) (LRB): LEFT SHOULDER ATHROSCOPY WITH DEBRIDEMENT, chromoplasty, and bursectomy. (Left) LEFT SUBACROMIAL DECOMPRESSION (Left) Patient reports pain as mild to left shoulder.  Reports palpations in the night. States his "my pacer is not set right". No SOB, CP, or palpations now. Reports doing well now. Girlfriend at bedside. Tolerating sling. Tolerating Po's.   Objective: Vital signs in last 24 hours: Temp:  [97.6 F (36.4 C)-98.3 F (36.8 C)] 97.6 F (36.4 C) (07/19 0402) Pulse Rate:  [68-96] 80 (07/19 0402) Resp:  [9-24] 16 (07/19 0402) BP: (100-136)/(57-100) 133/76 mmHg (07/19 0402) SpO2:  [96 %-100 %] 100 % (07/19 0402) Weight:  [75.297 kg (166 lb)] 75.297 kg (166 lb) (07/18 1224)  Intake/Output from previous day: 07/18 0701 - 07/19 0700 In: 1300 [I.V.:1000; IV Piggyback:300] Out: 25 [Blood:25] Intake/Output this shift:    No results for input(s): HGB in the last 72 hours. No results for input(s): WBC, RBC, HCT, PLT in the last 72 hours. No results for input(s): NA, K, CL, CO2, BUN, CREATININE, GLUCOSE, CALCIUM in the last 72 hours. No results for input(s): LABPT, INR in the last 72 hours.  Well nourished. Alert and oriented x3.  Paced HR, Lungs clear, BS x4. Abdomen soft and non tender. Sling to left arm. left shoulder dressing C/D/I. No DVT signs. Compartment soft. No signs of infection.  LUE grossly neurovascular intact.  Assessment/Plan: 1 Day Post-Op Procedure(s) (LRB): LEFT SHOULDER ATHROSCOPY WITH DEBRIDEMENT, chromoplasty, and bursectomy. (Left) LEFT SUBACROMIAL DECOMPRESSION (Left) [employer] to recheck pacer asap. Cardiology called the rep. Will reconsult cards if needed Plan to D/c home with family today F/u in office Follow instructions and take meds as directed F/u with cardiology   Wyatt Portela L 03/27/2016, 7:33 AM

## 2016-03-27 NOTE — Progress Notes (Signed)
Completed D/C teaching with patient. Answered all questions. Gave prescriptions. Pt will be D/C home with family in stable condition.

## 2016-03-27 NOTE — Telephone Encounter (Signed)
Called by the Orthopedics PA at 4:30am requesting a device interrogation for Mr. John Myers who is complaining of symptomatic palpitations, and her report has a HR=60-70s from a typical baseline in the 40s.  He has been experiencing significant anxiety, accompanied by these palpitations, since his shoulder surgery yesterday.  I discussed the case with the [employer] device rep who performed a device interrogation yesterday and at that time noted no abnormalities.  She will interrogate his device again this morning.  If a formal cardiology consult is requested after the results of this device interrogation then the PA, or the daytime service, will call for a cardiology consult at that time.  Clayborne Dana MD

## 2016-04-09 DIAGNOSIS — K001 Supernumerary teeth: Secondary | ICD-10-CM | POA: Diagnosis not present

## 2016-04-09 DIAGNOSIS — K006 Disturbances in tooth eruption: Secondary | ICD-10-CM | POA: Diagnosis not present

## 2016-04-09 DIAGNOSIS — K011 Impacted teeth: Secondary | ICD-10-CM | POA: Diagnosis not present

## 2016-05-03 DIAGNOSIS — K001 Supernumerary teeth: Secondary | ICD-10-CM | POA: Diagnosis not present

## 2016-05-03 DIAGNOSIS — I5022 Chronic systolic (congestive) heart failure: Secondary | ICD-10-CM | POA: Diagnosis not present

## 2016-05-03 DIAGNOSIS — K006 Disturbances in tooth eruption: Secondary | ICD-10-CM | POA: Diagnosis not present

## 2016-06-06 ENCOUNTER — Encounter: Payer: Self-pay | Admitting: Family Medicine

## 2016-06-06 ENCOUNTER — Ambulatory Visit (INDEPENDENT_AMBULATORY_CARE_PROVIDER_SITE_OTHER): Payer: BLUE CROSS/BLUE SHIELD | Admitting: Family Medicine

## 2016-06-06 VITALS — BP 138/76 | HR 70 | Temp 98.2°F | Wt 168.8 lb

## 2016-06-06 DIAGNOSIS — B353 Tinea pedis: Secondary | ICD-10-CM | POA: Diagnosis not present

## 2016-06-06 MED ORDER — TERBINAFINE HCL 1 % EX CREA
1.0000 | TOPICAL_CREAM | Freq: Two times a day (BID) | CUTANEOUS | 0 refills | Status: DC
Start: 2016-06-06 — End: 2016-10-01

## 2016-06-06 NOTE — Progress Notes (Signed)
Subjective:    Patient ID: John Myers, male    DOB: 12/25/1985, 30 y.o.   MRN: [id number]  HPI  [name] is a 30 year old male who presents today rash on feet for one year which is noted as worse over the past 2 weeks.  Associated pruritis is present and is mainly noticed when patient's feet are sweating.  He notes that his feet are dry . He denies fever, chills, sweats, or recent new medication changes. Treatment at home includes lotion only.  Lotion is a relieving factor. Wearing shoes with socks for an extended period of time is an exacerbating factor.   Review of Systems  Constitutional: Negative for chills, fatigue and fever.  Eyes: Negative for visual disturbance.  Respiratory: Negative for cough, shortness of breath and wheezing.   Cardiovascular: Negative for chest pain, palpitations and leg swelling.  Gastrointestinal: Negative for abdominal pain, diarrhea, nausea and vomiting.  Genitourinary: Negative for dysuria and hematuria.  Musculoskeletal: Negative for arthralgias and myalgias.  Skin: Negative for rash.  Neurological: Negative for dizziness, light-headedness and headaches.   Past Medical History:  Diagnosis Date  . Allergic rhinitis   . Depression   . Finger injury   . Gilbert syndrome 2005  . Premature ventricular contractions   . Right ventricular dysfunction   . Sick sinus syndrome ([place]) 2014   s/p PPM  . TGA (transposition of great arteries)    surgical correction at [facility] at 71 months of age     Social History   Social History  . Marital status: Single    Spouse name: N/A  . Number of children: N/A  . Years of education: N/A   Occupational History  . Not on file.   Social History Main Topics  . Smoking status: Never Smoker  . Smokeless tobacco: Never Used  . Alcohol use No  . Drug use: No  . Sexual activity: Not on file   Other Topics Concern  . 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 PACEMAKER INSERTION  03/08/13   MDT Adapta SR (atrial) PPM implanted by Dr Geanie Kenning at [facility] for sick sinus syndrome  . SHOULDER ARTHROSCOPY WITH CAPSULORRHAPHY Left 03/26/2016   Procedure: LEFT SHOULDER ATHROSCOPY WITH DEBRIDEMENT, chromoplasty, and bursectomy.;  Surgeon: Sydnee Cabal, MD;  Location: [facility];  Service: Orthopedics;  Laterality: Left;  ANESTHESIA: GENERAL/INTERSCALENE BLOCK  . SUBACROMIAL DECOMPRESSION Left 03/26/2016   Procedure: LEFT SUBACROMIAL DECOMPRESSION;  Surgeon: Sydnee Cabal, MD;  Location: [facility];  Service: Orthopedics;  Laterality: Left;  . TRANSPOSITION OF GREAT VESSELS REPAIR     Senning procedure performed at 27 months of age at [facility].    Family History  Problem Relation Age of Onset  . Hypertension    . Hyperlipidemia    . CAD      No Known Allergies  Current Outpatient Prescriptions on File Prior to Visit  Medication Sig Dispense Refill  . diphenhydrAMINE (BENADRYL) 25 MG tablet Take 25 mg by mouth every 6 (six) hours as needed for allergies.    . methocarbamol (ROBAXIN) 500 MG tablet Take 1 tablet (500 mg total) by mouth every 8 (eight) hours as needed for muscle spasms. (Patient not taking: Reported on 06/06/2016) 50  tablet 2  . oxyCODONE-acetaminophen (ROXICET) 5-325 MG tablet Take 1-2 tablets by mouth every 4 (four) hours as needed for severe pain. (Patient not taking: Reported on 06/06/2016) 60 tablet 0   No current facility-administered medications on file prior to visit.     BP 138/76 (BP Location: Right Leg, Patient Position: Sitting, Cuff Size: Normal)   Pulse 70   Temp 98.2 F (36.8 C) (Oral)   Wt 168 lb 12.8 oz (76.6 kg)   SpO2 99%   BMI 22.89 kg/m        Objective:   Physical Exam  Constitutional: He is oriented to person, place, and time. He appears well-developed and  well-nourished.  Eyes: Pupils are equal, round, and reactive to light. No scleral icterus.  Neck: Neck supple.  Cardiovascular: Normal rate, regular rhythm and intact distal pulses.   Pulmonary/Chest: Effort normal and breath sounds normal. He has no wheezes. He has no rales.  Abdominal: Soft. Bowel sounds are normal.  Lymphadenopathy:    He has no cervical adenopathy.  Neurological: He is alert and oriented to person, place, and time.  Skin: Skin is warm and dry. No rash noted.  Scaly, white flaky, and  slightly erythematous areas on toes and soles of feet .  No vesicular areas present.   Psychiatric: He has a normal mood and affect. His behavior is normal. Judgment and thought content normal.          Assessment & Plan:  1. Tinea pedis of both feet Exam and history support treatment of tinea pedis. Further instructions provided to patient regarding home care. If symptoms do not improve, worsen, or he develops new symptoms with treatment, advised follow up in 1 week. Patient voiced understanding and agreed with plan. - terbinafine (EQ ATHLETES FOOT, TERBINAFINE,) 1 % cream; Apply 1 application topically 2 (two) times daily.  Dispense: 30 g; Refill: 0  Delano Metz, FNP-C

## 2016-06-06 NOTE — Patient Instructions (Signed)
Please use medication as directed. If symptoms do not improve, worsen, or you develop new symptoms, please follow up for further evaluation .   Athlete's Foot Athlete's foot (tinea pedis) is a fungal infection of the skin on the feet. It often occurs on the skin between the toes or underneath the toes. It can also occur on the soles of the feet. Athlete's foot is more likely to occur in hot, humid weather. Not washing your feet or changing your socks often enough can contribute to athlete's foot. The infection can spread from person to person (contagious). CAUSES Athlete's foot is caused by a fungus. This fungus thrives in warm, moist places. Most people get athlete's foot by sharing shower stalls, towels, and wet floors with an infected person. People with weakened immune systems, including those with diabetes, may be more likely to get athlete's foot. SYMPTOMS   Itchy areas between the toes or on the soles of the feet.  White, flaky, or scaly areas between the toes or on the soles of the feet.  Tiny, intensely itchy blisters between the toes or on the soles of the feet.  Tiny cuts on the skin. These cuts can develop a bacterial infection.  Thick or discolored toenails. DIAGNOSIS  Your caregiver can usually tell what the problem is by doing a physical exam. Your caregiver may also take a skin sample from the rash area. The skin sample may be examined under a microscope, or it may be tested to see if fungus will grow in the sample. A sample may also be taken from your toenail for testing. TREATMENT  Over-the-counter and prescription medicines can be used to kill the fungus. These medicines are available as powders or creams. Your caregiver can suggest medicines for you. Fungal infections respond slowly to treatment. You may need to continue using your medicine for several weeks. PREVENTION   Do not share towels.  Wear sandals in wet areas, such as shared locker rooms and shared  showers.  Keep your feet dry. Wear shoes that allow air to circulate. Wear cotton or wool socks. HOME CARE INSTRUCTIONS   Take medicines as directed by your caregiver. Do not use steroid creams on athlete's foot.  Keep your feet clean and cool. Wash your feet daily and dry them thoroughly, especially between your toes.  Change your socks every day. Wear cotton or wool socks. In hot climates, you may need to change your socks 2 to 3 times per day.  Wear sandals or canvas tennis shoes with good air circulation.  If you have blisters, soak your feet in Burow's solution or Epsom salts for 20 to 30 minutes, 2 times a day to dry out the blisters. Make sure you dry your feet thoroughly afterward. SEEK MEDICAL CARE IF:   You have a fever.  You have swelling, soreness, warmth, or redness in your foot.  You are not getting better after 7 days of treatment.  You are not completely cured after 30 days.  You have any problems caused by your medicines. MAKE SURE YOU:   Understand these instructions.  Will watch your condition.  Will get help right away if you are not doing well or get worse.   This information is not intended to replace advice given to you by your health care provider. Make sure you discuss any questions you have with your health care provider.   Document Released: 08/23/2000 Document Revised: 11/18/2011 Document Reviewed: 02/27/2015 Elsevier Interactive Patient Education [employer].

## 2016-06-17 ENCOUNTER — Ambulatory Visit: Payer: BLUE CROSS/BLUE SHIELD | Admitting: Family Medicine

## 2016-06-21 DIAGNOSIS — M545 Low back pain: Secondary | ICD-10-CM | POA: Diagnosis not present

## 2016-06-21 DIAGNOSIS — M542 Cervicalgia: Secondary | ICD-10-CM | POA: Diagnosis not present

## 2016-06-25 DIAGNOSIS — S62346A Nondisplaced fracture of base of fifth metacarpal bone, right hand, initial encounter for closed fracture: Secondary | ICD-10-CM | POA: Diagnosis not present

## 2016-06-25 DIAGNOSIS — S62344A Nondisplaced fracture of base of fourth metacarpal bone, right hand, initial encounter for closed fracture: Secondary | ICD-10-CM | POA: Diagnosis not present

## 2016-06-27 DIAGNOSIS — S62316A Displaced fracture of base of fifth metacarpal bone, right hand, initial encounter for closed fracture: Secondary | ICD-10-CM | POA: Diagnosis not present

## 2016-06-27 DIAGNOSIS — S62314A Displaced fracture of base of fourth metacarpal bone, right hand, initial encounter for closed fracture: Secondary | ICD-10-CM | POA: Diagnosis not present

## 2016-06-28 NOTE — Progress Notes (Signed)
Anesthesia Note: Patient is a 30 year old male scheduled for right hand closed reduction and pinning, possible ORIF on 07/03/16 by Dr. Caralyn Guile. Anesthesia is posted for axillary block with sedation.  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 Bary Castilla, [facility]) '88, sinus node dysfunction s/p single atrial lead transvenous pacemaker 03/08/13 (Dr. Sallee Provencal), RV dysfunction, PVCs, Rosanna Randy Syndrome, left shoulder arthroscopy/bursectomy acromioplasty and CA ligament release 03/26/16 [facility]). Cardiology notes indicate that he has been intolerant to both b-blockers and ACE inhibitors in the past.   PCP is Dr. Bluford Kaufmann. Most recently his cardiologist have been with [facility], Dr. Lonni Fix with EP (last visit 11/03/15) and Dr. Jeralyn Bennett as his congenital heart disease specialist (last visit 11/16/15). However, on 03/14/16, patient decided that he no longer wanted to be seen there due to concerns that they were not returning his phone calls in a timely manner. He is not sure who he will follow-up with in the future. (He saw Dr. Rayann Heman with [facility] in 2015-2016 for a second opionin, but continued care with [facility] at that time.) With his shoulder surgery in July 2017, Dr. Jeraldine Loots indicated that patient had a Medtronic PPM and was pacer dependent. For his shoulder surgery, Dr. Jeraldine Loots recommended pacemaker be placed on asynchronous mode (AOO) at at least 70 bpm prior to case and then returned to prior setting afterwards.   03/27/16 EKG: NSR, incomplete right BBB, possible RVH.  05/18/15 Echo ([facility]; Care Everywhere): INTERPRETATION SUMMARY D-Transposition status-post Senning operation Mildly dilated systemic (morphologic right) ventricle with mildly reduced systolic function. Mild systemic AV (morphologic tricuspid) valve regurgitation. Trivial to mild venous AV (morphologic mitral) valve  regurgitation. Trivial to mild subpulmonary obstruction. No baffle leaks or baffle obstruction noted.  02/02/13 Stress Echo ([facility]; Care Everywhere): Interpretation: ABNORMAL STRESS TEST. Note: STRESS TEST FOR SUB-PULMONARY OUTFLOW GRADIENTS. INCREASED FROM 2.70M/SEC, (27MMHG) AT REST TO 4.45M/SEC TO (77MMHG) WITH EXERCISE.BOTH VENOUS AND SYSTEMIC VENTRICLES AUGMENT AND THICKEN WITH EXERCISE. ECG POSITIVE IN LEAD V3 ONLY IN STRESS AND RECOVERY.NO OBVIOUS WALL MOTION ABNORMALITIES (see resting study from same day).Maximum workload of 17.10 METs was achieved during exercise. (Findings reviewed by Dr. Budd Palmer who wrote, "Treadmill stress echo noted increased outflow gradient with exercise (approx 80). HR response was normal. Will discuss with surgery possibilities for correction, however feel that this is unlikely to be of significant benefit as the risk involved are much greater that the potential benefits. Given his EP findings he may benefit from an atrial pacemaker, I will discuss this with our EP team." He was referred to Dr. Armanda Heritage and underwent single chamber atrial PPM on 03/08/13. Additional records can be reviewed in [facility])   Reviewed with anesthesiologist Dr. Linna Caprice. Patient was recently cleared by his [facility] cardiologists for shoulder surgery in July, but is now in between cardiologists (has not yet gotten established with new cardiologists). Would recommend Medtronic rep interrogate device prior to surgery. Decision for overnight stay would depend on his recovery course in PACU.   George Hugh [facility] Short Stay Center/Anesthesiology Phone [phone] 06/28/2016 5:42 PM

## 2016-06-28 NOTE — H&P (Signed)
John Myers is an 30 y.o. male.   Chief Complaint: Right ring and small finger metacarpal base fractures HPI: Pt hit stationary object Pt here for surgery Pt with no prior surgery   Past Medical History:  Diagnosis Date  . Allergic rhinitis   . Depression   . Finger injury   . Gilbert syndrome 2005  . Premature ventricular contractions   . Right ventricular dysfunction   . Sick sinus syndrome ([place]) 2014   s/p PPM  . TGA (transposition of great arteries)    surgical correction at [facility] at 4 months of age    Past Surgical History:  Procedure Laterality Date  . PACEMAKER INSERTION  03/08/13   MDT Adapta SR (atrial) PPM implanted by Dr Geanie Kenning at [facility] for sick sinus syndrome  . SHOULDER ARTHROSCOPY WITH CAPSULORRHAPHY Left 03/26/2016   Procedure: LEFT SHOULDER ATHROSCOPY WITH DEBRIDEMENT, chromoplasty, and bursectomy.;  Surgeon: Sydnee Cabal, MD;  Location: [facility];  Service: Orthopedics;  Laterality: Left;  ANESTHESIA: GENERAL/INTERSCALENE BLOCK  . SUBACROMIAL DECOMPRESSION Left 03/26/2016   Procedure: LEFT SUBACROMIAL DECOMPRESSION;  Surgeon: Sydnee Cabal, MD;  Location: [facility];  Service: Orthopedics;  Laterality: Left;  . TRANSPOSITION OF GREAT VESSELS REPAIR     Senning procedure performed at 81 months of age at [facility].    Family History  Problem Relation Age of Onset  . Hypertension    . Hyperlipidemia    . CAD     Social History:  reports that he has never smoked. He has never used smokeless tobacco. He reports that he does not drink alcohol or use drugs.  Allergies: No Known Allergies  No prescriptions prior to admission.    No results found for this or any previous visit (from the past 48 hour(s)). No results found.  ROS NO RECENT ILLNESSES OR HOSPITALIZATIONS  There were no vitals taken for this visit. Physical Exam  General Appearance:  Alert, cooperative, no distress, appears stated age  Head:  Normocephalic, without obvious abnormality, atraumatic  Eyes:   Pupils equal, conjunctiva/corneas clear,         Throat: Lips, mucosa, and tongue normal; teeth and gums normal  Neck: No visible masses     Lungs:   respirations unlabored  Chest Wall:  No tenderness or deformity  Heart:  Regular rate and rhythm,  Abdomen:   Soft, non-tender,         Extremities: RIGHT HAND MODERATE SWELLING FINGERS WARM WELL PERFUSED GOOD WRIST AND DIGITAL MOBILITY GOOD FOREARM ROTATION TTP OVER ULNAR SIDE OF WRIST  Pulses: 2+ and symmetric  Skin: Skin color, texture, turgor normal, no rashes or lesions     Neurologic: Normal    Assessment/Plan RIGHT RING AND SMALL FINGER METACARPAL BASE FRACTURES, DISPLACED   RIGHT RING AND SMALL FINGER CLOSED MANIPULATION AND PERCUTANEOUS PINNING POSSIBLE OPEN REDUCTION AND INTERNAL FIXATION  R/B/A DISCUSSED WITH PT IN OFFICE.  PT VOICED UNDERSTANDING OF PLAN CONSENT SIGNED DAY OF SURGERY PT SEEN AND EXAMINED PRIOR TO OPERATIVE PROCEDURE/DAY OF SURGERY SITE MARKED. QUESTIONS ANSWERED WILL GO HOME FOLLOWING SURGERY  WE ARE PLANNING SURGERY FOR YOUR UPPER EXTREMITY. THE RISKS AND BENEFITS OF SURGERY INCLUDE BUT NOT LIMITED TO BLEEDING INFECTION, DAMAGE TO NEARBY NERVES ARTERIES TENDONS, FAILURE OF SURGERY TO ACCOMPLISH ITS INTENDED GOALS, PERSISTENT SYMPTOMS AND NEED FOR FURTHER SURGICAL INTERVENTION. WITH THIS IN MIND WE WILL PROCEED. I HAVE DISCUSSED WITH THE PATIENT THE PRE AND POSTOPERATIVE REGIMEN AND THE DOS AND DON'TS. PT VOICED UNDERSTANDING AND INFORMED CONSENT SIGNED.  John Myers 07/02/2016 AT 1454

## 2016-07-01 ENCOUNTER — Encounter (HOSPITAL_COMMUNITY): Payer: Self-pay | Admitting: *Deleted

## 2016-07-01 NOTE — Progress Notes (Addendum)
Mr John Myers reports that he does not have a cardiologist and problaby will not have one because they do not have one where he lives, [place].  Patient reports that he has chest pain - mid chest at times, last time probable 3 weeks ago, patient reports that the pian last approximately 2 minutes, he gets more short of breath and that at times he has felt diaphoretic, but has not experienced any n/v.  Mr John Myers reports that chest pain may start while he is doing something or while he is sitting and not doing anything.  Mr John Myers reports that he is short of breath most of the time and that he has told his cardiologist in the past about the chest pain and shortness of breath and he was told that it is to be expected.  Mr John Myers also reports that he sometimes has pain around the pacemaker pocket.  Patient reports that he takes Ibuprofen if needed for pain.  I instructed patient to not take any more Ibuprofen until his Dr says it is ok to take it after OR.

## 2016-07-03 ENCOUNTER — Ambulatory Visit (HOSPITAL_COMMUNITY)
Admission: RE | Admit: 2016-07-03 | Discharge: 2016-07-03 | Disposition: A | Discharge status: Home / Self Care | Payer: BLUE CROSS/BLUE SHIELD | Source: Ambulatory Visit | Attending: Orthopedic Surgery | Admitting: Orthopedic Surgery

## 2016-07-03 ENCOUNTER — Ambulatory Visit (HOSPITAL_COMMUNITY): Payer: BLUE CROSS/BLUE SHIELD | Admitting: Vascular Surgery

## 2016-07-03 ENCOUNTER — Encounter (HOSPITAL_COMMUNITY)
Admission: RE | Disposition: A | Discharge status: Home / Self Care | Payer: Self-pay | Source: Ambulatory Visit | Attending: Orthopedic Surgery

## 2016-07-03 ENCOUNTER — Encounter (HOSPITAL_COMMUNITY): Payer: Self-pay | Admitting: *Deleted

## 2016-07-03 DIAGNOSIS — Z9889 Other specified postprocedural states: Secondary | ICD-10-CM | POA: Diagnosis not present

## 2016-07-03 DIAGNOSIS — S62314A Displaced fracture of base of fourth metacarpal bone, right hand, initial encounter for closed fracture: Secondary | ICD-10-CM | POA: Insufficient documentation

## 2016-07-03 DIAGNOSIS — I739 Peripheral vascular disease, unspecified: Secondary | ICD-10-CM | POA: Insufficient documentation

## 2016-07-03 DIAGNOSIS — S62316A Displaced fracture of base of fifth metacarpal bone, right hand, initial encounter for closed fracture: Secondary | ICD-10-CM | POA: Diagnosis not present

## 2016-07-03 DIAGNOSIS — Z8249 Family history of ischemic heart disease and other diseases of the circulatory system: Secondary | ICD-10-CM | POA: Diagnosis not present

## 2016-07-03 DIAGNOSIS — W228XXA Striking against or struck by other objects, initial encounter: Secondary | ICD-10-CM | POA: Diagnosis not present

## 2016-07-03 DIAGNOSIS — Z950 Presence of cardiac pacemaker: Secondary | ICD-10-CM | POA: Diagnosis not present

## 2016-07-03 DIAGNOSIS — S63054A Dislocation of other carpometacarpal joint of right hand, initial encounter: Secondary | ICD-10-CM | POA: Diagnosis not present

## 2016-07-03 HISTORY — PX: CLOSED REDUCTION METACARPAL WITH PERCUTANEOUS PINNING: SHX5613

## 2016-07-03 HISTORY — DX: Dyspnea, unspecified: R06.00

## 2016-07-03 HISTORY — DX: Presence of cardiac pacemaker: Z95.0

## 2016-07-03 LAB — COMPREHENSIVE METABOLIC PANEL
ALT: 36 U/L (ref 17–63)
AST: 32 U/L (ref 15–41)
Albumin: 5.1 g/dL — ABNORMAL HIGH (ref 3.5–5.0)
Alkaline Phosphatase: 61 U/L (ref 38–126)
Anion gap: 11 (ref 5–15)
BUN: 9 mg/dL (ref 6–20)
CHLORIDE: 104 mmol/L (ref 101–111)
CO2: 23 mmol/L (ref 22–32)
Calcium: 9.9 mg/dL (ref 8.9–10.3)
Creatinine, Ser: 0.78 mg/dL (ref 0.61–1.24)
Glucose, Bld: 83 mg/dL (ref 65–99)
POTASSIUM: 4 mmol/L (ref 3.5–5.1)
SODIUM: 138 mmol/L (ref 135–145)
Total Bilirubin: 2.3 mg/dL — ABNORMAL HIGH (ref 0.3–1.2)
Total Protein: 8.2 g/dL — ABNORMAL HIGH (ref 6.5–8.1)

## 2016-07-03 LAB — CBC
HEMATOCRIT: 45.2 % (ref 39.0–52.0)
HEMOGLOBIN: 16.3 g/dL (ref 13.0–17.0)
MCH: 30.1 pg (ref 26.0–34.0)
MCHC: 36.1 g/dL — AB (ref 30.0–36.0)
MCV: 83.5 fL (ref 78.0–100.0)
Platelets: 146 10*3/uL — ABNORMAL LOW (ref 150–400)
RBC: 5.41 MIL/uL (ref 4.22–5.81)
RDW: 12.2 % (ref 11.5–15.5)
WBC: 9 10*3/uL (ref 4.0–10.5)

## 2016-07-03 SURGERY — CLOSED REDUCTION, FRACTURE, METACARPAL BONE, WITH PERCUTANEOUS PINNING
Anesthesia: Monitor Anesthesia Care | Site: Hand | Laterality: Right

## 2016-07-03 MED ORDER — MIDAZOLAM HCL 2 MG/2ML IJ SOLN
INTRAMUSCULAR | Status: AC
  Administered 2016-07-03: 2 mg
  Filled 2016-07-03: qty 2

## 2016-07-03 MED ORDER — LIDOCAINE-EPINEPHRINE (PF) 1.5 %-1:200000 IJ SOLN
INTRAMUSCULAR | Status: DC | PRN
  Administered 2016-07-03: 15 mL via PERINEURAL

## 2016-07-03 MED ORDER — ONDANSETRON HCL 4 MG/2ML IJ SOLN: 4.0000 mg | Freq: Once | INTRAMUSCULAR | Status: DC | PRN

## 2016-07-03 MED ORDER — OXYCODONE-ACETAMINOPHEN 5-325 MG PO TABS: 1.0000 | ORAL_TABLET | ORAL | 0 refills | Status: DC | PRN

## 2016-07-03 MED ORDER — FENTANYL CITRATE (PF) 100 MCG/2ML IJ SOLN
INTRAMUSCULAR | Status: DC | PRN
  Administered 2016-07-03 (×4): 50 ug via INTRAVENOUS

## 2016-07-03 MED ORDER — FENTANYL CITRATE (PF) 100 MCG/2ML IJ SOLN
INTRAMUSCULAR | Status: AC
  Filled 2016-07-03: qty 2

## 2016-07-03 MED ORDER — MIDAZOLAM HCL 5 MG/5ML IJ SOLN
INTRAMUSCULAR | Status: DC | PRN
  Administered 2016-07-03: 2 mg via INTRAVENOUS

## 2016-07-03 MED ORDER — LACTATED RINGERS IV SOLN
INTRAVENOUS | Status: DC | PRN
  Administered 2016-07-03: 15:00:00 via INTRAVENOUS

## 2016-07-03 MED ORDER — FENTANYL CITRATE (PF) 100 MCG/2ML IJ SOLN
INTRAMUSCULAR | Status: AC
  Administered 2016-07-03: 75 ug
  Filled 2016-07-03: qty 2

## 2016-07-03 MED ORDER — FENTANYL CITRATE (PF) 100 MCG/2ML IJ SOLN: 25.0000 ug | INTRAMUSCULAR | Status: DC | PRN

## 2016-07-03 MED ORDER — BUPIVACAINE HCL (PF) 0.5 % IJ SOLN
INTRAMUSCULAR | Status: DC | PRN
  Administered 2016-07-03: 15 mL via PERINEURAL

## 2016-07-03 MED ORDER — PROPOFOL 10 MG/ML IV BOLUS
INTRAVENOUS | Status: AC
  Filled 2016-07-03: qty 20

## 2016-07-03 MED ORDER — MIDAZOLAM HCL 2 MG/2ML IJ SOLN
INTRAMUSCULAR | Status: AC
  Filled 2016-07-03: qty 2

## 2016-07-03 MED ORDER — CHLORHEXIDINE GLUCONATE 4 % EX LIQD: 60.0000 mL | Freq: Once | CUTANEOUS | Status: DC

## 2016-07-03 MED ORDER — CEFAZOLIN SODIUM-DEXTROSE 2-4 GM/100ML-% IV SOLN
2.0000 g | INTRAVENOUS | Status: AC
  Administered 2016-07-03: 2 g via INTRAVENOUS
  Filled 2016-07-03: qty 100

## 2016-07-03 SURGICAL SUPPLY — 39 items
APL SKNCLS STERI-STRIP NONHPOA (GAUZE/BANDAGES/DRESSINGS)
BANDAGE ACE 4X5 VEL STRL LF (GAUZE/BANDAGES/DRESSINGS) IMPLANT
BANDAGE ELASTIC 3 VELCRO ST LF (GAUZE/BANDAGES/DRESSINGS) ×2 IMPLANT
BANDAGE ELASTIC 4 VELCRO ST LF (GAUZE/BANDAGES/DRESSINGS) ×1 IMPLANT
BENZOIN TINCTURE PRP APPL 2/3 (GAUZE/BANDAGES/DRESSINGS) IMPLANT
BLADE SURG ROTATE 9660 (MISCELLANEOUS) IMPLANT
BNDG GAUZE ELAST 4 BULKY (GAUZE/BANDAGES/DRESSINGS) ×2 IMPLANT
COVER SURGICAL LIGHT HANDLE (MISCELLANEOUS) ×2 IMPLANT
CUFF TOURNIQUET SINGLE 18IN (TOURNIQUET CUFF) IMPLANT
CUFF TOURNIQUET SINGLE 24IN (TOURNIQUET CUFF) IMPLANT
DRAPE OEC MINIVIEW 54X84 (DRAPES) ×2 IMPLANT
DRSG EMULSION OIL 3X3 NADH (GAUZE/BANDAGES/DRESSINGS) IMPLANT
GAUZE SPONGE 4X4 12PLY STRL (GAUZE/BANDAGES/DRESSINGS) IMPLANT
GAUZE SPONGE 4X4 16PLY XRAY LF (GAUZE/BANDAGES/DRESSINGS) ×1 IMPLANT
GAUZE XEROFORM 1X8 LF (GAUZE/BANDAGES/DRESSINGS) ×1 IMPLANT
GLOVE BIOGEL PI IND STRL 8.5 (GLOVE) ×1 IMPLANT
GLOVE BIOGEL PI INDICATOR 8.5 (GLOVE) ×1
GLOVE SURG ORTHO 8.0 STRL STRW (GLOVE) ×2 IMPLANT
GOWN STRL REUS W/ TWL LRG LVL3 (GOWN DISPOSABLE) ×2 IMPLANT
GOWN STRL REUS W/TWL LRG LVL3 (GOWN DISPOSABLE) ×4
K-WIRE DBL END TROCAR 6X.045 (WIRE) ×6
KIT BASIN OR (CUSTOM PROCEDURE TRAY) ×2 IMPLANT
KIT ROOM TURNOVER OR (KITS) ×2 IMPLANT
KWIRE DBL END TROCAR 6X.045 (WIRE) IMPLANT
NS IRRIG 1000ML POUR BTL (IV SOLUTION) ×2 IMPLANT
PACK ORTHO EXTREMITY (CUSTOM PROCEDURE TRAY) ×2 IMPLANT
PAD ARMBOARD 7.5X6 YLW CONV (MISCELLANEOUS) ×4 IMPLANT
PAD CAST 4YDX4 CTTN HI CHSV (CAST SUPPLIES) IMPLANT
PADDING CAST COTTON 4X4 STRL (CAST SUPPLIES) ×2
SOAP 2 % CHG 4 OZ (WOUND CARE) ×2 IMPLANT
SPLINT FIBERGLASS 3X12 (CAST SUPPLIES) ×1 IMPLANT
STRIP CLOSURE SKIN 1/2X4 (GAUZE/BANDAGES/DRESSINGS) IMPLANT
SUT ETHILON 4 0 P 3 18 (SUTURE) IMPLANT
SUT ETHILON 5 0 P 3 18 (SUTURE)
SUT NYLON ETHILON 5-0 P-3 1X18 (SUTURE) IMPLANT
SUT PROLENE 4 0 P 3 18 (SUTURE) IMPLANT
TOWEL OR 17X24 6PK STRL BLUE (TOWEL DISPOSABLE) ×2 IMPLANT
TOWEL OR 17X26 10 PK STRL BLUE (TOWEL DISPOSABLE) ×2 IMPLANT
WATER STERILE IRR 1000ML POUR (IV SOLUTION) ×2 IMPLANT

## 2016-07-03 NOTE — Op Note (Signed)
DICTATED.

## 2016-07-03 NOTE — Transfer of Care (Signed)
Immediate Anesthesia Transfer of Care Note  Patient: John Myers  Procedure(s) Performed: Procedure(s) with comments: RIGHT HAND CLOSED REDUCTION AND PINNING (Right) - AXILLARY BLOCK WITH SEDATION  Patient Location: PACU  Anesthesia Type:MAC  Level of Consciousness: awake, alert , oriented and patient cooperative  Airway & Oxygen Therapy: Patient Spontanous Breathing  Post-op Assessment: Report given to RN and Post -op Vital signs reviewed and stable  Post vital signs: Reviewed and stable  Last Vitals:  Vitals:   07/03/16 1434 07/03/16 1435  BP: (!) 151/54   Pulse: 70 75  Resp: 15 13  Temp:      Last Pain:  Vitals:   07/03/16 1429  TempSrc: Oral         Complications: No apparent anesthesia complications

## 2016-07-03 NOTE — Anesthesia Procedure Notes (Signed)
Anesthesia Regional Block:  Supraclavicular block  Pre-Anesthetic Checklist: ,, timeout performed, Correct Patient, Correct Site, Correct Laterality, Correct Procedure, Correct Position, site marked, Risks and benefits discussed,  Surgical consent,  Pre-op evaluation,  At surgeon's request and post-op pain management  Laterality: Right  Prep: chloraprep       Needles:  Injection technique: Single-shot  Needle Type: Stimiplex     Needle Length: 5cm 5 cm Needle Gauge: 21 G    Additional Needles:  Procedures: ultrasound guided (picture in chart) Supraclavicular block Narrative:  Injection made incrementally with aspirations every 5 mL.  Performed by: Personally  Anesthesiologist: Shamara Soza  Additional Notes: Risks, benefits and alternative to block explained extensively.  Patient tolerated procedure well, without complications.

## 2016-07-03 NOTE — Anesthesia Procedure Notes (Signed)
Procedure Name: MAC Date/Time: 07/03/2016 3:00 PM Performed by: Lance Coon Pre-anesthesia Checklist: Patient identified, Emergency Drugs available, Suction available, Patient being monitored and Timeout performed Patient Re-evaluated:Patient Re-evaluated prior to inductionOxygen Delivery Method: Simple face mask

## 2016-07-03 NOTE — Discharge Instructions (Signed)
KEEP BANDAGE CLEAN AND DRY CALL OFFICE FOR F/U APPT [phone] in 15 days KEEP HAND ELEVATED ABOVE HEART OK TO APPLY ICE TO OPERATIVE AREA CONTACT OFFICE IF ANY WORSENING PAIN OR CONCERNS.  

## 2016-07-03 NOTE — Anesthesia Preprocedure Evaluation (Signed)
Anesthesia Evaluation  Patient identified by MRN, date of birth, ID band Patient awake    Reviewed: Allergy & Precautions, NPO status , Patient's Chart, lab work & pertinent test results  History of Anesthesia Complications Negative for: history of anesthetic complications  Airway Mallampati: I  TM Distance: >3 FB     Dental  (+) Teeth Intact   Pulmonary neg pulmonary ROS,    breath sounds clear to auscultation       Cardiovascular + Peripheral Vascular Disease  + pacemaker  Rhythm:Regular Rate:Normal + Systolic murmurs TGA from birth. Senning procedure at 3 months . And pacemaker dependent  He does have some fatigue c exertion, This is recent in last year  Abn EKG noted,paced     Neuro/Psych Depression negative neurological ROS     GI/Hepatic negative GI ROS, Neg liver ROS,   Endo/Other  negative endocrine ROS  Renal/GU negative Renal ROS     Musculoskeletal   Abdominal   Peds  Hematology negative hematology ROS (+)   Anesthesia Other Findings   Reproductive/Obstetrics                             Anesthesia Physical  Anesthesia Plan  ASA: III  Anesthesia Plan: Regional and MAC   Post-op Pain Management:    Induction: Intravenous  Airway Management Planned: Simple Face Mask  Additional Equipment:   Intra-op Plan:   Post-operative Plan:   Informed Consent: I have reviewed the patients History and Physical, chart, labs and discussed the procedure including the risks, benefits and alternatives for the proposed anesthesia with the patient or authorized representative who has indicated his/her understanding and acceptance.   Dental advisory given  Plan Discussed with: Anesthesiologist, CRNA and Surgeon  Anesthesia Plan Comments:         Anesthesia Quick Evaluation

## 2016-07-03 NOTE — Anesthesia Postprocedure Evaluation (Signed)
Anesthesia Post Note  Patient: John Myers  Procedure(s) Performed: Procedure(s) (LRB): RIGHT HAND CLOSED REDUCTION AND PINNING (Right)  Patient location during evaluation: PACU Anesthesia Type: MAC and Regional Level of consciousness: awake and alert Pain management: pain level controlled Vital Signs Assessment: post-procedure vital signs reviewed and stable Respiratory status: spontaneous breathing, nonlabored ventilation, respiratory function stable and patient connected to nasal cannula oxygen Cardiovascular status: stable and blood pressure returned to baseline Anesthetic complications: no    Last Vitals:  Vitals:   07/03/16 1434 07/03/16 1435  BP: (!) 151/54   Pulse: 70 75  Resp: 15 13  Temp:      Last Pain:  Vitals:   07/03/16 1429  TempSrc: Oral                 Zenaida Deed

## 2016-07-03 NOTE — Progress Notes (Addendum)
John Myers, rep for medtronic has been called...John Myers into interogating pacer.  Please call for post op interrogation.

## 2016-07-04 ENCOUNTER — Encounter (HOSPITAL_COMMUNITY): Payer: Self-pay | Admitting: Orthopedic Surgery

## 2016-07-04 NOTE — Op Note (Signed)
NAMEJOMETH, MANAOIS               ACCOUNT NO.:  [id number]  MEDICAL RECORD NO.:  [id number]  LOCATION:  [facility]                         FACILITY:  [facility]  PHYSICIAN:  [name] IV, M.D.DATE OF BIRTH:  1986-05-08  DATE OF PROCEDURE:  07/03/2016 DATE OF DISCHARGE:  07/03/2016                              OPERATIVE REPORT   PREOPERATIVE DIAGNOSIS:  Right ring finger and small finger metacarpal base fractures involving the carpometacarpal joint.  POSTOPERATIVE DIAGNOSIS:  Right ring finger and small finger metacarpal base fractures involving the carpometacarpal joint.  ATTENDING PHYSICIAN:  [name], M.D., who scrubbed and present for the entire procedure.  ASSISTANT SURGEON:  None.  ANESTHESIA:  Supraclavicular block with IV sedation.  PROCEDURES: 1. Percutaneous skeletal fixation of unstable 5th carpometacarpal     fracture, dislocation. 2. Percutaneous skeletal fixation of unstable 4th carpometacarpal     fracture, dislocation. 3. Radiographs 3 views, right hand.  SURGICAL INDICATIONS:  Mr. Britts is a right-hand-dominant male, who sustained a closed fracture, dislocation of the 4th and 5th CMC joints. The patient was seen and evaluated and recommended to undergo the above procedure.  Risks include but not limited to bleeding, infection, damage to nearby nerves, arteries, or tendons; loss motion of the wrist and digits, incomplete relief of symptoms, and need for further surgical intervention.  DESCRIPTION OF PROCEDURE:  The patient was properly identified in the preoperative holding area, marked with a permanent marker made on the right hand to indicate the correct operative site.  The patient was brought back to the operating room, placed supine on the anesthesia room table, where the IV sedation was administered with the block.  A well- padded tourniquet was placed on the right brachium and sealed with 1000 drape.  The right upper extremity was then prepped and  draped in a normal sterile fashion.  Time-out was called, correct site was identified, and the procedure then begun.  Attention was then turned to the right hand.  Closed manipulation was then done of the 4th and 5th CMC fracture, dislocations and closed manipulation was successful in maintaining reduction.  Once longitudinal traction was in place, two 0.045 K-wires were then placed in transmetacarpal from the 5th into the 4th metacarpals.  This held out to length.  Following this, an additional K-wire was placed in the 5th metacarpal across the [facility] joint stabilizing the [facility] joint.  The pins were then cut and bent, left out of the skin.  Xeroform and bolster dressing was applied around the pins. After treatment of both CMC fracture, dislocations, the pin sites were then irrigated.  Sterile compressive bandage was then applied.  The patient was then placed in a well-padded volar ulnar gutter splint, taken to recovery room in good condition.  RADIOGRAPHIC INTERPRETATION:  An AP, lateral, and oblique views of the hand did show the cross K-wire fixation placed in good position.  POSTPROCEDURE PLAN:  The patient is to be discharged home.  Seen back in my office in approximately 2 weeks for pin check, x-rays, application of a short-arm cast, pins out, and seen back at the 4-week mark.  X-rays out of the cast and pin at the 4-week mark  and then begin an outpatient therapy regimen.  I will put in a therapy order at the first postoperative visit.  Radiographs at each visit.     Melrose Nakayama, M.D.     FWO/MEDQ  D:  07/03/2016  T:  07/04/2016  Job:  [id number]

## 2016-07-16 DIAGNOSIS — S62314D Displaced fracture of base of fourth metacarpal bone, right hand, subsequent encounter for fracture with routine healing: Secondary | ICD-10-CM | POA: Diagnosis not present

## 2016-07-29 DIAGNOSIS — S62316D Displaced fracture of base of fifth metacarpal bone, right hand, subsequent encounter for fracture with routine healing: Secondary | ICD-10-CM | POA: Diagnosis not present

## 2016-08-26 DIAGNOSIS — S62316D Displaced fracture of base of fifth metacarpal bone, right hand, subsequent encounter for fracture with routine healing: Secondary | ICD-10-CM | POA: Diagnosis not present

## 2016-09-23 ENCOUNTER — Other Ambulatory Visit: Payer: Self-pay | Admitting: Orthopedic Surgery

## 2016-09-23 DIAGNOSIS — S62314D Displaced fracture of base of fourth metacarpal bone, right hand, subsequent encounter for fracture with routine healing: Secondary | ICD-10-CM

## 2016-10-01 ENCOUNTER — Encounter: Payer: Self-pay | Admitting: Internal Medicine

## 2016-10-01 ENCOUNTER — Ambulatory Visit (INDEPENDENT_AMBULATORY_CARE_PROVIDER_SITE_OTHER): Payer: BLUE CROSS/BLUE SHIELD | Admitting: Internal Medicine

## 2016-10-01 VITALS — BP 126/64 | HR 70 | Temp 98.2°F | Ht 72.0 in | Wt 163.6 lb

## 2016-10-01 DIAGNOSIS — Z950 Presence of cardiac pacemaker: Secondary | ICD-10-CM | POA: Diagnosis not present

## 2016-10-01 DIAGNOSIS — Q203 Discordant ventriculoarterial connection: Secondary | ICD-10-CM | POA: Diagnosis not present

## 2016-10-01 DIAGNOSIS — K5900 Constipation, unspecified: Secondary | ICD-10-CM

## 2016-10-01 MED ORDER — POLYETHYLENE GLYCOL 3350 17 GM/SCOOP PO POWD: 17.0000 g | Freq: Two times a day (BID) | ORAL | 1 refills | Status: DC | PRN

## 2016-10-01 NOTE — Progress Notes (Signed)
Pre visit review using our clinic review tool, if applicable. No additional management support is needed unless otherwise documented below in the visit note. 

## 2016-10-01 NOTE — Patient Instructions (Addendum)
Constipation, Adult °Constipation is when a person: °· Poops (has a bowel movement) fewer times in a week than normal. °· Has a hard time pooping. °· Has poop that is dry, hard, or bigger than normal. ° °Follow these instructions at home: °Eating and drinking ° °· Eat foods that have a lot of fiber, such as: °? Fresh fruits and vegetables. °? Whole grains. °? Beans. °· Eat less of foods that are high in fat, low in fiber, or overly processed, such as: °? French fries. °? Hamburgers. °? Cookies. °? Candy. °? Soda. °· Drink enough fluid to keep your pee (urine) clear or pale yellow. °General instructions °· Exercise regularly or as told by your doctor. °· Go to the restroom when you feel like you need to poop. Do not hold it in. °· Take over-the-counter and prescription medicines only as told by your doctor. These include any fiber supplements. °· Do pelvic floor retraining exercises, such as: °? Doing deep breathing while relaxing your lower belly (abdomen). °? Relaxing your pelvic floor while pooping. °· Watch your condition for any changes. °· Keep all follow-up visits as told by your doctor. This is important. °Contact a doctor if: °· You have pain that gets worse. °· You have a fever. °· You have not pooped for 4 days. °· You throw up (vomit). °· You are not hungry. °· You lose weight. °· You are bleeding from the anus. °· You have thin, pencil-like poop (stool). °Get help right away if: °· You have a fever, and your symptoms suddenly get worse. °· You leak poop or have blood in your poop. °· Your belly feels hard or bigger than normal (is bloated). °· You have very bad belly pain. °· You feel dizzy or you faint. °This information is not intended to replace advice given to you by your health care provider. Make sure you discuss any questions you have with your health care provider. °Document Released: 02/12/2008 Document Revised: 03/15/2016 Document Reviewed: 02/14/2016 °Elsevier Interactive Patient Education ©  2017 Elsevier Inc. ° °

## 2016-10-01 NOTE — Progress Notes (Signed)
Subjective:    Patient ID: John Myers, male    DOB: 03-20-1986, 31 y.o.   MRN: [id number]  HPI  31 year old patient his past medical history is remarkable for a history of TGA.  He also has a history of SSA and is status post cardiac pacemaker.  He is followed at [facility] cardiology and is scheduled for a follow-up visit next month Patient has had some severe constipation since yesterday morning.  He awoke at 2 AM with significant crampy abdominal pain.  This was quite intense and associated with diaphoresis.  He took some unknown laxative and he has had several softer low quantity bowel movements today.  These have been associated with a small amount of dark blood.  He states that over the years he has had occasional blood in the stool.  He has had some analgesics on hand but has not taken in 6 days.  He took last week due to some hand pain that was left over from prior shoulder surgery.  No nausea or vomiting  Past Medical History:  Diagnosis Date  . Allergic rhinitis   . Depression   . Dyspnea   . Finger injury   . Gilbert syndrome 2005  . Premature ventricular contractions   . Presence of permanent cardiac pacemaker   . Right ventricular dysfunction   . Sick sinus syndrome ([place]) 2014   s/p PPM  . TGA (transposition of great arteries)    surgical correction at [facility] at 26 months of age     Social History   Social History  . Marital status: Single    Spouse name: N/A  . Number of children: N/A  . Years of education: N/A   Occupational History  . Not on file.   Social History Main Topics  . Smoking status: Never Smoker  . Smokeless tobacco: Never Used  . Alcohol use No  . Drug use: No  . Sexual activity: Not on file   Other Topics Concern  . 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 CLOSED REDUCTION METACARPAL WITH PERCUTANEOUS PINNING Right 07/03/2016   Procedure: RIGHT HAND CLOSED REDUCTION AND PINNING;  Surgeon: Iran Planas, MD;  Location: [place];  Service: Orthopedics;  Laterality: Right;  AXILLARY BLOCK WITH SEDATION  . PACEMAKER INSERTION  03/08/13   MDT Adapta SR (atrial) PPM implanted by Dr Geanie Kenning at [facility] for sick sinus syndrome  . SHOULDER ARTHROSCOPY WITH CAPSULORRHAPHY Left 03/26/2016   Procedure: LEFT SHOULDER ATHROSCOPY WITH DEBRIDEMENT, chromoplasty, and bursectomy.;  Surgeon: Sydnee Cabal, MD;  Location[facility];  Service: Orthopedics;  Laterality: Left;  ANESTHESIA: GENERAL/INTERSCALENE BLOCK  . SUBACROMIAL DECOMPRESSION Left 03/26/2016   Procedure: LEFT SUBACROMIAL DECOMPRESSION;  Surgeon: Sydnee Cabal, MD;  [facility];  Service: Orthopedics;  Laterality: Left;  . TRANSPOSITION OF GREAT VESSELS REPAIR     Senning procedure performed at 10 months of age at [facility].  . WISDOM TOOTH EXTRACTION      Family History  Problem Relation Age of Onset  . Hypertension    . Hyperlipidemia    . CAD      No Known Allergies  No current outpatient prescriptions on file prior to visit.   No current facility-administered medications on file prior  to visit.     BP 126/64 (BP Location: Right Arm, Patient Position: Sitting, Cuff Size: Normal)   Pulse 70   Temp 98.2 F (36.8 C) (Oral)   Ht 6' (1.829 m)   Wt 163 lb 9.6 oz (74.2 kg)   SpO2 98%   BMI 22.19 kg/m     Review of Systems  Constitutional: Negative for appetite change, chills, fatigue and fever.  HENT: Negative for congestion, dental problem, ear pain, hearing loss, sore throat, tinnitus, trouble swallowing and voice change.   Eyes: Negative for pain, discharge and visual disturbance.  Respiratory: Negative for cough, chest tightness, wheezing and stridor.   Cardiovascular: Negative for chest pain, palpitations and leg swelling.  Gastrointestinal: Positive for abdominal pain,  blood in stool and constipation. Negative for abdominal distention, diarrhea, nausea and vomiting.  Genitourinary: Negative for difficulty urinating, discharge, flank pain, genital sores, hematuria and urgency.  Musculoskeletal: Negative for arthralgias, back pain, gait problem, joint swelling, myalgias and neck stiffness.  Skin: Negative for rash.  Neurological: Negative for dizziness, syncope, speech difficulty, weakness, numbness and headaches.  Hematological: Negative for adenopathy. Does not bruise/bleed easily.  Psychiatric/Behavioral: Negative for behavioral problems and dysphoric mood. The patient is not nervous/anxious.        Objective:   Physical Exam  Constitutional: He is oriented to person, place, and time. He appears well-developed. No distress.  Blood pressure normal No tachycardia  HENT:  Head: Normocephalic.  Right Ear: External ear normal.  Left Ear: External ear normal.  Eyes: Conjunctivae and EOM are normal.  Neck: Normal range of motion.  Cardiovascular: Normal rate and normal heart sounds.   Grade 99991111 systolic murmur Loud single S2  Sternotomy scar  Pacemaker left upper anterior chest wall  Pulmonary/Chest: Breath sounds normal.  Abdominal: Soft. Bowel sounds are normal. There is tenderness.  Mild tenderness in the left lower quadrant  Genitourinary:  Genitourinary Comments: Rectal exam was unremarkable.  Scanty amount of stool was heme-negative No impacted stool noted  Musculoskeletal: Normal range of motion. He exhibits no edema or tenderness.  Neurological: He is alert and oriented to person, place, and time.  Psychiatric: He has a normal mood and affect. His behavior is normal.          Assessment & Plan:   Constipation.  Measures discussed including liberal fluid intake.  High-fiber diet and increase activity.  Will place on short-term stool softeners and consider MiraLAX twice daily.  Short-term He'll report any worsening pain or recurrent  lower GI bleeding Status post transposition of great arteries.  Follow-up [facility] cardiology next month as scheduled  Nyoka Cowden

## 2016-10-04 ENCOUNTER — Ambulatory Visit
Admission: RE | Admit: 2016-10-04 | Discharge: 2016-10-04 | Disposition: A | Discharge status: Home / Self Care | Payer: BLUE CROSS/BLUE SHIELD | Source: Ambulatory Visit | Attending: Orthopedic Surgery | Admitting: Orthopedic Surgery

## 2016-10-04 DIAGNOSIS — S62314D Displaced fracture of base of fourth metacarpal bone, right hand, subsequent encounter for fracture with routine healing: Secondary | ICD-10-CM

## 2016-10-04 DIAGNOSIS — R937 Abnormal findings on diagnostic imaging of other parts of musculoskeletal system: Secondary | ICD-10-CM | POA: Diagnosis not present

## 2016-10-04 DIAGNOSIS — S62314A Displaced fracture of base of fourth metacarpal bone, right hand, initial encounter for closed fracture: Secondary | ICD-10-CM | POA: Diagnosis not present

## 2016-10-17 DIAGNOSIS — I495 Sick sinus syndrome: Secondary | ICD-10-CM | POA: Diagnosis not present

## 2016-10-17 DIAGNOSIS — R5382 Chronic fatigue, unspecified: Secondary | ICD-10-CM | POA: Diagnosis not present

## 2016-10-17 DIAGNOSIS — Z9889 Other specified postprocedural states: Secondary | ICD-10-CM | POA: Diagnosis not present

## 2016-10-17 DIAGNOSIS — R002 Palpitations: Secondary | ICD-10-CM | POA: Diagnosis not present

## 2016-10-17 DIAGNOSIS — Z950 Presence of cardiac pacemaker: Secondary | ICD-10-CM | POA: Diagnosis not present

## 2016-10-17 DIAGNOSIS — Q203 Discordant ventriculoarterial connection: Secondary | ICD-10-CM | POA: Diagnosis not present

## 2017-01-13 ENCOUNTER — Ambulatory Visit (INDEPENDENT_AMBULATORY_CARE_PROVIDER_SITE_OTHER): Payer: Self-pay | Admitting: Family Medicine

## 2017-01-13 ENCOUNTER — Telehealth: Payer: Self-pay | Admitting: Internal Medicine

## 2017-01-13 VITALS — Temp 98.4°F | Ht 72.0 in | Wt 162.2 lb

## 2017-01-13 DIAGNOSIS — R21 Rash and other nonspecific skin eruption: Secondary | ICD-10-CM

## 2017-01-13 MED ORDER — TRIAMCINOLONE ACETONIDE 0.1 % EX CREA: 1.0000 "application " | TOPICAL_CREAM | Freq: Two times a day (BID) | CUTANEOUS | 0 refills | Status: DC

## 2017-01-13 NOTE — Telephone Encounter (Signed)
Opened in error

## 2017-01-13 NOTE — Patient Instructions (Signed)
Make appointment with dermatologist if no resolving with the following:  Keep feet clean and dry.  Lotrimin twice daily for 1 month- available over the counter.  Steroid cream 1-2 times daily (sent to pharmacy).  I hope you are feeling better soon!

## 2017-01-13 NOTE — Progress Notes (Signed)
Pre visit review using our clinic review tool, if applicable. No additional management support is needed unless otherwise documented below in the visit note. Unable to completely check patient in due to him being on the phone.

## 2017-01-13 NOTE — Progress Notes (Signed)
HPI:  Acute visit for skin rash on feet: -x2 years -reports saw his doctor and another doc about this and they gave him creams which he does think helped -itchy and burning dry skin on feet -no pus, cracking, fever, malaise  ROS: See pertinent positives and negatives per HPI.  Past Medical History:  Diagnosis Date  . Allergic rhinitis   . Depression   . Dyspnea   . Finger injury   . Gilbert syndrome 2005  . Premature ventricular contractions   . Presence of permanent cardiac pacemaker   . Right ventricular dysfunction   . Sick sinus syndrome ([place]) 2014   s/p PPM  . TGA (transposition of great arteries)    surgical correction at [facility] at 51 months of age    Past Surgical History:  Procedure Laterality Date  . CLOSED REDUCTION METACARPAL WITH PERCUTANEOUS PINNING Right 07/03/2016   Procedure: RIGHT HAND CLOSED REDUCTION AND PINNING;  Surgeon: Iran Planas, MD;  Location: [place];  Service: Orthopedics;  Laterality: Right;  AXILLARY BLOCK WITH SEDATION  . PACEMAKER INSERTION  03/08/13   MDT Adapta SR (atrial) PPM implanted by Dr Geanie Kenning at [facility] for sick sinus syndrome  . SHOULDER ARTHROSCOPY WITH CAPSULORRHAPHY Left 03/26/2016   Procedure: LEFT SHOULDER ATHROSCOPY WITH DEBRIDEMENT, chromoplasty, and bursectomy.;  Surgeon: Sydnee Cabal, MD;  [facility];  Service: Orthopedics;  Laterality: Left;  ANESTHESIA: GENERAL/INTERSCALENE BLOCK  . SUBACROMIAL DECOMPRESSION Left 03/26/2016   Procedure: LEFT SUBACROMIAL DECOMPRESSION;  Surgeon: Sydnee Cabal, MD;  [facility];  Service: Orthopedics;  Laterality: Left;  . TRANSPOSITION OF GREAT VESSELS REPAIR     Senning procedure performed at 22 months of age at [facility].  . WISDOM TOOTH EXTRACTION      Family History  Problem Relation Age of Onset  . Hypertension    . Hyperlipidemia    . CAD      Social History   Social History  . Marital status: Single    Spouse name: N/A  . Number of children: N/A  . Years of education: N/A    Social History Main Topics  . Smoking status: Never Smoker  . Smokeless tobacco: Never Used  . Alcohol use No  . Drug use: No  . Sexual activity: Not on file   Other Topics Concern  . 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  polyethylene glycol powder (GLYCOLAX/MIRALAX) powder, Take 17 g by mouth 2 (two) times daily as needed., Disp: 3350 g, Rfl: 1 .  triamcinolone cream (KENALOG) 0.1 %, Apply 1 application topically 2 (two) times daily., Disp: 30 g, Rfl: 0  EXAM:  Vitals:   01/13/17 1702  Temp: 98.4 F (36.9 C)    Body mass index is 22 kg/m.  GENERAL: vitals reviewed and listed above, alert, oriented, appears well hydrated and in no acute distress  HEENT: atraumatic, conjunttiva clear, no obvious abnormalities on inspection of external nose and ears  NECK: no obvious masses on inspection  SKIN: plaques of dry skin on both feet with fine erythematous papules  MS: moves all extremities without noticeable abnormality  PSYCH: pleasant and cooperative, no obvious depression or anxiety  ASSESSMENT AND PLAN:  Discussed the following assessment and plan:  Skin rash  -he is frustrated with  this and feels likely needs to see dermatologist -advised of dermatology options -discussed potential etiologies and advised could do trial antifungal and steroid in interim -Patient advised to return or notify a doctor immediately if symptoms worsen or persist or new concerns arise.  Patient Instructions  Make appointment with dermatologist if no resolving with the following:  Keep feet clean and dry.  Lotrimin twice daily for 1 month- available over the counter.  Steroid cream 1-2 times daily (sent to pharmacy).  I hope you are feeling better soon!      Colin Benton R., DO

## 2017-02-24 ENCOUNTER — Encounter: Payer: Self-pay | Admitting: Internal Medicine

## 2017-02-24 DIAGNOSIS — L441 Lichen nitidus: Secondary | ICD-10-CM | POA: Diagnosis not present

## 2017-03-17 ENCOUNTER — Encounter: Payer: Self-pay | Admitting: Internal Medicine

## 2017-03-17 ENCOUNTER — Ambulatory Visit (INDEPENDENT_AMBULATORY_CARE_PROVIDER_SITE_OTHER): Payer: BLUE CROSS/BLUE SHIELD | Admitting: Internal Medicine

## 2017-03-17 VITALS — BP 130/72 | HR 70 | Temp 98.5°F | Ht 72.0 in | Wt 157.6 lb

## 2017-03-17 DIAGNOSIS — R0600 Dyspnea, unspecified: Secondary | ICD-10-CM

## 2017-03-17 DIAGNOSIS — I495 Sick sinus syndrome: Secondary | ICD-10-CM | POA: Diagnosis not present

## 2017-03-17 DIAGNOSIS — Q203 Discordant ventriculoarterial connection: Secondary | ICD-10-CM

## 2017-03-17 DIAGNOSIS — Z950 Presence of cardiac pacemaker: Secondary | ICD-10-CM | POA: Diagnosis not present

## 2017-03-17 DIAGNOSIS — J3089 Other allergic rhinitis: Secondary | ICD-10-CM

## 2017-03-17 MED ORDER — FLUTICASONE PROPIONATE HFA 110 MCG/ACT IN AERO: 2.0000 | INHALATION_SPRAY | Freq: Every day | RESPIRATORY_TRACT | 12 refills | Status: DC

## 2017-03-17 MED ORDER — ALBUTEROL SULFATE HFA 108 (90 BASE) MCG/ACT IN AERS: 2.0000 | INHALATION_SPRAY | Freq: Four times a day (QID) | RESPIRATORY_TRACT | 0 refills | Status: DC | PRN

## 2017-03-17 NOTE — Progress Notes (Signed)
Subjective:    Patient ID: John Myers, male    DOB: 1986-04-03, 31 y.o.   MRN: [id number]  HPI  31 year old patient with a significant past medical history of transposition of the great arteries.  He has a history of sick sinus syndrome and is status post pacemaker insertion.  He is followed by [facility] cardiology. He presents today with a chief complaint of shortness of breath with intermittent wheezing.  He does have a long history of allergic rhinitis and has been using Benadryl as well as fluticasone nasal spray.  For the past several weeks he has had increasing shortness of breath at times associated with audible wheezing.  Symptoms are bothersome at night and also throughout the day.  He states that he is exposed to a very dusty environment at work and is also been active with golf and other  outdoor activities. No prior history of asthma  Past Medical History:  Diagnosis Date  . Allergic rhinitis   . Depression   . Dyspnea   . Finger injury   . Gilbert syndrome 2005  . Premature ventricular contractions   . Presence of permanent cardiac pacemaker   . Right ventricular dysfunction   . Sick sinus syndrome ([place]) 2014   s/p PPM  . TGA (transposition of great arteries)    surgical correction at [facility] at 17 months of age     Social History   Social History  . Marital status: Single    Spouse name: N/A  . Number of children: N/A  . Years of education: N/A   Occupational History  . Not on file.   Social History Main Topics  . Smoking status: Never Smoker  . Smokeless tobacco: Never Used  . Alcohol use No  . Drug use: No  . Sexual activity: Not on file   Other Topics Concern  . 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 CLOSED REDUCTION METACARPAL WITH PERCUTANEOUS  PINNING Right 07/03/2016   Procedure: RIGHT HAND CLOSED REDUCTION AND PINNING;  Surgeon: Iran Planas, MD;  Location: [place];  Service: Orthopedics;  Laterality: Right;  AXILLARY BLOCK WITH SEDATION  . PACEMAKER INSERTION  03/08/13   MDT Adapta SR (atrial) PPM implanted by Dr Geanie Kenning at [facility] for sick sinus syndrome  . SHOULDER ARTHROSCOPY WITH CAPSULORRHAPHY Left 03/26/2016   Procedure: LEFT SHOULDER ATHROSCOPY WITH DEBRIDEMENT, chromoplasty, and bursectomy.;  Surgeon: Sydnee Cabal, MD;  [facility];  Service: Orthopedics;  Laterality: Left;  ANESTHESIA: GENERAL/INTERSCALENE BLOCK  . SUBACROMIAL DECOMPRESSION Left 03/26/2016   Procedure: LEFT SUBACROMIAL DECOMPRESSION;  Surgeon: Sydnee Cabal, MD;  [facility];  Service: Orthopedics;  Laterality: Left;  . TRANSPOSITION OF GREAT VESSELS REPAIR     Senning procedure performed at 31 months of age at [facility].  . WISDOM TOOTH EXTRACTION      Family History  Problem Relation Age of Onset  . Hypertension Unknown   . Hyperlipidemia Unknown   . CAD Unknown     No Known Allergies  No current outpatient prescriptions on file prior to visit.   No current facility-administered medications on file prior to visit.     BP 130/72 (BP Location: Left Arm, Patient Position: Sitting, Cuff Size: Normal)   Pulse 70  Temp 98.5 F (36.9 C) (Oral)   Ht 6' (1.829 m)   Wt 157 lb 9.6 oz (71.5 kg)   SpO2 98%   BMI 21.37 kg/m      Review of Systems  Constitutional: Negative for appetite change, chills, fatigue and fever.  HENT: Positive for congestion. Negative for dental problem, ear pain, hearing loss, sore throat, tinnitus, trouble swallowing and voice change.   Eyes: Negative for pain, discharge and visual disturbance.  Respiratory: Positive for shortness of breath and wheezing. Negative for cough, chest tightness and stridor.   Cardiovascular: Negative for chest pain, palpitations and leg swelling.  Gastrointestinal: Negative for abdominal  distention, abdominal pain, blood in stool, constipation, diarrhea, nausea and vomiting.  Genitourinary: Negative for difficulty urinating, discharge, flank pain, genital sores, hematuria and urgency.  Musculoskeletal: Negative for arthralgias, back pain, gait problem, joint swelling, myalgias and neck stiffness.  Skin: Negative for rash.  Neurological: Positive for headaches. Negative for dizziness, syncope, speech difficulty, weakness and numbness.  Hematological: Negative for adenopathy. Does not bruise/bleed easily.  Psychiatric/Behavioral: Negative for behavioral problems and dysphoric mood. The patient is not nervous/anxious.        Objective:   Physical Exam  Constitutional: He is oriented to person, place, and time. He appears well-developed.  HENT:  Head: Normocephalic.  Right Ear: External ear normal.  Left Ear: External ear normal.  Eyes: Conjunctivae and EOM are normal.  Neck: Normal range of motion.  Cardiovascular: Normal rate.   Murmur heard. Prominent S1, S2 , single Grade 2/6 systolic murmur  Pulmonary/Chest: Breath sounds normal. No respiratory distress. He has no wheezes.  O2 saturation 98% No distress  Abdominal: Bowel sounds are normal.  Musculoskeletal: Normal range of motion. He exhibits no edema or tenderness.  Neurological: He is alert and oriented to person, place, and time.  Psychiatric: He has a normal mood and affect. His behavior is normal.          Assessment & Plan:   Episodic dyspnea with wheezing.  Symptoms are nocturnal and also notable throughout the day. Status post surgery for TGA age 31 months Status post pacemaker for SSS History of allergic rhinitis  We'll check PFTs.  Treat with inhalational steroid.  Prescription for rescue albuterol.  Also written    Follow-up 4 weeks or sooner if symptoms worsen Follow-up [facility] cardiology  Nyoka Cowden

## 2017-03-17 NOTE — Patient Instructions (Signed)
Return in 4 weeks for follow-up Pulmonary studies as discussed  Report any new or worsening symptoms  Consider a nonsedating antihistamine such as Allegra, Clarinex, or Zyrtec  Continue fluticasone nasal spray

## 2017-03-18 ENCOUNTER — Encounter (HOSPITAL_COMMUNITY): Payer: Self-pay | Admitting: Emergency Medicine

## 2017-03-18 ENCOUNTER — Emergency Department (HOSPITAL_COMMUNITY): Payer: BLUE CROSS/BLUE SHIELD

## 2017-03-18 ENCOUNTER — Emergency Department (HOSPITAL_COMMUNITY)
Admission: EM | Admit: 2017-03-18 | Discharge: 2017-03-18 | Disposition: A | Discharge status: Home / Self Care | Payer: BLUE CROSS/BLUE SHIELD | Attending: Emergency Medicine | Admitting: Emergency Medicine

## 2017-03-18 DIAGNOSIS — R05 Cough: Secondary | ICD-10-CM | POA: Diagnosis not present

## 2017-03-18 DIAGNOSIS — J208 Acute bronchitis due to other specified organisms: Secondary | ICD-10-CM

## 2017-03-18 DIAGNOSIS — J209 Acute bronchitis, unspecified: Secondary | ICD-10-CM | POA: Diagnosis not present

## 2017-03-18 DIAGNOSIS — R0602 Shortness of breath: Secondary | ICD-10-CM | POA: Diagnosis not present

## 2017-03-18 DIAGNOSIS — Z950 Presence of cardiac pacemaker: Secondary | ICD-10-CM | POA: Insufficient documentation

## 2017-03-18 DIAGNOSIS — Z7951 Long term (current) use of inhaled steroids: Secondary | ICD-10-CM | POA: Insufficient documentation

## 2017-03-18 LAB — I-STAT TROPONIN, ED: Troponin i, poc: 0 ng/mL (ref 0.00–0.08)

## 2017-03-18 LAB — CBC WITH DIFFERENTIAL/PLATELET
BASOS ABS: 0.1 10*3/uL (ref 0.0–0.1)
Basophils Relative: 0.7 % (ref 0.0–3.0)
EOS ABS: 1 10*3/uL — AB (ref 0.0–0.7)
Eosinophils Relative: 10.2 % — ABNORMAL HIGH (ref 0.0–5.0)
HCT: 45 % (ref 39.0–52.0)
Hemoglobin: 15.6 g/dL (ref 13.0–17.0)
LYMPHS ABS: 2.5 10*3/uL (ref 0.7–4.0)
Lymphocytes Relative: 24.9 % (ref 12.0–46.0)
MCHC: 34.7 g/dL (ref 30.0–36.0)
MCV: 85.6 fl (ref 78.0–100.0)
MONO ABS: 0.7 10*3/uL (ref 0.1–1.0)
Monocytes Relative: 7.3 % (ref 3.0–12.0)
Neutro Abs: 5.6 10*3/uL (ref 1.4–7.7)
Neutrophils Relative %: 56.9 % (ref 43.0–77.0)
Platelets: 159 10*3/uL (ref 150.0–400.0)
RBC: 5.26 Mil/uL (ref 4.22–5.81)
RDW: 13.4 % (ref 11.5–15.5)
WBC: 9.8 10*3/uL (ref 4.0–10.5)

## 2017-03-18 LAB — CBC
HEMATOCRIT: 46.3 % (ref 39.0–52.0)
Hemoglobin: 16 g/dL (ref 13.0–17.0)
MCH: 29.4 pg (ref 26.0–34.0)
MCHC: 34.6 g/dL (ref 30.0–36.0)
MCV: 85 fL (ref 78.0–100.0)
Platelets: 134 10*3/uL — ABNORMAL LOW (ref 150–400)
RBC: 5.45 MIL/uL (ref 4.22–5.81)
RDW: 12.9 % (ref 11.5–15.5)
WBC: 7.6 10*3/uL (ref 4.0–10.5)

## 2017-03-18 LAB — BASIC METABOLIC PANEL
ANION GAP: 10 (ref 5–15)
BUN: 12 mg/dL (ref 6–20)
CHLORIDE: 104 mmol/L (ref 101–111)
CO2: 23 mmol/L (ref 22–32)
Calcium: 9.6 mg/dL (ref 8.9–10.3)
Creatinine, Ser: 0.84 mg/dL (ref 0.61–1.24)
Glucose, Bld: 101 mg/dL — ABNORMAL HIGH (ref 65–99)
POTASSIUM: 3.8 mmol/L (ref 3.5–5.1)
SODIUM: 137 mmol/L (ref 135–145)

## 2017-03-18 MED ORDER — AZITHROMYCIN 250 MG PO TABS: 250.0000 mg | ORAL_TABLET | Freq: Every day | ORAL | 0 refills | Status: DC

## 2017-03-18 NOTE — ED Triage Notes (Signed)
Pt. Stated, I went [facility] yesterday and they said they didn't know and had rx 2 inhalers and have not pick up yet. This all started about 2 weeks ago and it seems like its getting worse and worse. It comes on the SOB for no reason.

## 2017-03-18 NOTE — ED Notes (Signed)
MD Delo at bedside. 

## 2017-03-18 NOTE — ED Notes (Addendum)
Pt unhooked self from heart monitor and AMBULATED TO RR.

## 2017-03-18 NOTE — Discharge Instructions (Signed)
Zithromax as prescribed.  Inhalers as recommended by your primary Dr.  Return to the emergency department if symptoms significant worsening or change, and follow up with your primary Dr. if not improving in the next week.

## 2017-03-18 NOTE — ED Provider Notes (Signed)
[facility] DEPT Provider Note   CSN: [id number] Arrival date & time: 03/18/17  0712     History   Chief Complaint Chief Complaint  Patient presents with  . Shortness of Breath    HPI John Myers is a 31 y.o. male.  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 He presents today for evaluation of shortness of breath. This has been worsening over the past 2 weeks. This occurs episodically, and mostly in the morning. He reports coughing up phlegm in the morning. He does work and live in a dusty environment and believes there may be some allergic or infectious component. He was seen yesterday at the [facility] clinic. He was given inhalers, however has not filled these as of yet. He was told he may have asthma, but has never had this before.   The history is provided by the patient.  Shortness of Breath  This is a new problem. The average episode lasts 2 weeks. The problem occurs intermittently.The problem has been gradually worsening. Associated symptoms include cough and sputum production. Pertinent negatives include no fever, no leg pain and no leg swelling. The problem's precipitants include pollens and weather/humidity. He has tried nothing for the symptoms.    Past Medical History:  Diagnosis Date  . Allergic rhinitis   . Depression   . Dyspnea   . Finger injury   . Gilbert syndrome 2005  . Premature ventricular contractions   . Presence of permanent cardiac pacemaker   . Right ventricular dysfunction   . Sick sinus syndrome ([place]) 2014   s/p PPM  . TGA (transposition of great arteries)    surgical correction at [facility] at 20 months of age    Patient Active Problem List   Diagnosis Date Noted  . S/P shoulder surgery 03/26/2016  . BPPV (benign paroxysmal positional vertigo) 03/09/2015  . Sick sinus syndrome ([place]) 03/12/2014  .  Vasovagal near syncope 03/12/2014  . Fatigue 03/12/2014  . Infection of index finger 07/08/2013  . Finger injury 07/08/2013  . Cardiac pacemaker   . TGA (transposition of great arteries)   . Allergic rhinitis 01/22/2013  . URI 11/18/2008  . DEPRESSION 05/19/2007    Past Surgical History:  Procedure Laterality Date  . CLOSED REDUCTION METACARPAL WITH PERCUTANEOUS PINNING Right 07/03/2016   Procedure: RIGHT HAND CLOSED REDUCTION AND PINNING;  Surgeon: Iran Planas, MD;  Location: [facility];  Service: Orthopedics;  Laterality: Right;  AXILLARY BLOCK WITH SEDATION  . PACEMAKER INSERTION  03/08/13   MDT Adapta SR (atrial) PPM implanted by Dr Geanie Kenning at [facility] for sick sinus syndrome  . SHOULDER ARTHROSCOPY WITH CAPSULORRHAPHY Left 03/26/2016   Procedure: LEFT SHOULDER ATHROSCOPY WITH DEBRIDEMENT, chromoplasty, and bursectomy.;  Surgeon: Sydnee Cabal, MD;  [facility];  Service: Orthopedics;  Laterality: Left;  ANESTHESIA: GENERAL/INTERSCALENE BLOCK  . SUBACROMIAL DECOMPRESSION Left 03/26/2016   Procedure: LEFT SUBACROMIAL DECOMPRESSION;  Surgeon: Sydnee Cabal, MD;  [facility];  Service: Orthopedics;  Laterality: Left;  . TRANSPOSITION OF GREAT VESSELS REPAIR     Senning procedure performed at 68 months of age at [facility]  . WISDOM TOOTH EXTRACTION         Home Medications    Prior to Admission medications   Medication Sig Start Date End Date Taking? Authorizing Provider  albuterol (PROVENTIL HFA;VENTOLIN HFA) 108 (90 Base) MCG/ACT inhaler Inhale 2 puffs into  the lungs every 6 (six) hours as needed for wheezing or shortness of breath. 03/17/17   Marletta Lor, MD  diphenhydrAMINE (BENADRYL) 25 mg capsule Take 25 mg by mouth every 6 (six) hours as needed for allergies.    [provider]  fluticasone (FLOVENT HFA) 110 MCG/ACT inhaler Inhale 2 puffs into the lungs daily. 03/17/17   Marletta Lor, MD    Family History Family History  Problem Relation Age of Onset    . Hypertension Unknown   . Hyperlipidemia Unknown   . CAD Unknown     Social History Social History  Substance Use Topics  . Smoking status: Never Smoker  . Smokeless tobacco: Never Used  . Alcohol use No     Allergies   Patient has no known allergies.   Review of Systems Review of Systems  Constitutional: Negative for fever.  Respiratory: Positive for cough, sputum production and shortness of breath.   Cardiovascular: Negative for leg swelling.  All other systems reviewed and are negative.    Physical Exam Updated Vital Signs BP 118/81 (BP Location: Right Arm)   Pulse 70   Temp (!) 97.4 F (36.3 C) (Oral)   Resp (!) 93   Ht 6' (1.829 m)   Wt 71.2 kg (157 lb)   SpO2 100%   BMI 21.29 kg/m   Physical Exam  Constitutional: He is oriented to person, place, and time. He appears well-developed and well-nourished. No distress.  HENT:  Head: Normocephalic and atraumatic.  Mouth/Throat: Oropharynx is clear and moist.  Neck: Normal range of motion. Neck supple.  Cardiovascular: Normal rate and regular rhythm.  Exam reveals no friction rub.   No murmur heard. Pulmonary/Chest: Effort normal and breath sounds normal. No respiratory distress. He has no wheezes. He has no rales.  There is an accentuated S2 audible  Abdominal: Soft. Bowel sounds are normal. He exhibits no distension. There is no tenderness.  Musculoskeletal: Normal range of motion. He exhibits no edema.  Neurological: He is alert and oriented to person, place, and time. Coordination normal.  Skin: Skin is warm and dry. He is not diaphoretic.  Nursing note and vitals reviewed.    ED Treatments / Results  Labs (all labs ordered are listed, but only abnormal results are displayed) Labs Reviewed  BASIC METABOLIC PANEL  CBC  I-STAT Axis, ED    EKG  EKG Interpretation None       Radiology Dg Chest 2 View  Result Date: 03/18/2017 CLINICAL DATA:  Increasing shortness of breath for 2-3 weeks  EXAM: CHEST  2 VIEW COMPARISON:  04/28/2013 FINDINGS: Left-sided single chamber pacer with somewhat high position. Patient has history of transposition of great arteries. Normal heart size and unremarkable mediastinal contours. There is no edema, consolidation, effusion, or pneumothorax. Remote superior endplate fracture of T4. IMPRESSION: Stable chest.  No evidence of active disease. Electronically Signed   By: Monte Fantasia M.D.   On: 03/18/2017 08:35    Procedures Procedures (including critical care time)  Medications Ordered in ED Medications - No data to display   Initial Impression / Assessment and Plan / ED Course  I have reviewed the triage vital signs and the nursing notes.  Pertinent labs & imaging results that were available during my care of the patient were reviewed by me and considered in my medical decision making (see chart for details).  Patient is a 31 year old male with history of congenital heart disease with surgical repair as an infant presenting with  chest congestion and productive cough for the past 2 weeks. He was seen by his primary doctor yesterday and prescribed inhalers which she has not filled yet. This morning he woke with additional cough and difficulty breathing and presents for evaluation of this.  His EKG is unchanged, chest x-ray is clear, laboratory studies are reassuring. The most likely scenario is that this is an acute bronchitis and I will treat with Zithromax in addition to the inhalers that he was recommended yesterday. I see no indication at this time for further workup. If his symptoms do not improve in the next week, or worsen, he can follow-up with his primary Dr. or return for reevaluation.  Final Clinical Impressions(s) / ED Diagnoses   Final diagnoses:  None    New Prescriptions New Prescriptions   No medications on file     Veryl Speak, MD 03/18/17 [phone]

## 2017-03-19 ENCOUNTER — Telehealth: Payer: Self-pay | Admitting: Internal Medicine

## 2017-03-19 NOTE — Telephone Encounter (Signed)
called pt to inform of appt states he does not want keep his  appt for PFT   and do do want to reschedule /03/19/2017/dd

## 2017-03-31 DIAGNOSIS — Z4789 Encounter for other orthopedic aftercare: Secondary | ICD-10-CM | POA: Diagnosis not present

## 2017-03-31 DIAGNOSIS — M7542 Impingement syndrome of left shoulder: Secondary | ICD-10-CM | POA: Diagnosis not present

## 2017-04-17 DIAGNOSIS — Z950 Presence of cardiac pacemaker: Secondary | ICD-10-CM | POA: Diagnosis not present

## 2017-04-17 DIAGNOSIS — R072 Precordial pain: Secondary | ICD-10-CM | POA: Diagnosis not present

## 2017-04-17 DIAGNOSIS — I495 Sick sinus syndrome: Secondary | ICD-10-CM | POA: Diagnosis not present

## 2017-06-19 DIAGNOSIS — R0602 Shortness of breath: Secondary | ICD-10-CM | POA: Diagnosis not present

## 2017-06-19 DIAGNOSIS — Z9889 Other specified postprocedural states: Secondary | ICD-10-CM | POA: Diagnosis not present

## 2017-06-19 DIAGNOSIS — Q203 Discordant ventriculoarterial connection: Secondary | ICD-10-CM | POA: Diagnosis not present

## 2017-06-19 DIAGNOSIS — R5383 Other fatigue: Secondary | ICD-10-CM | POA: Diagnosis not present

## 2017-07-29 ENCOUNTER — Emergency Department (HOSPITAL_COMMUNITY): Payer: BLUE CROSS/BLUE SHIELD

## 2017-07-29 ENCOUNTER — Emergency Department (HOSPITAL_COMMUNITY)
Admission: EM | Admit: 2017-07-29 | Discharge: 2017-07-29 | Disposition: A | Discharge status: Home / Self Care | Payer: BLUE CROSS/BLUE SHIELD | Attending: Emergency Medicine | Admitting: Emergency Medicine

## 2017-07-29 ENCOUNTER — Encounter (HOSPITAL_COMMUNITY): Payer: Self-pay

## 2017-07-29 DIAGNOSIS — R569 Unspecified convulsions: Secondary | ICD-10-CM | POA: Diagnosis not present

## 2017-07-29 DIAGNOSIS — R55 Syncope and collapse: Secondary | ICD-10-CM | POA: Diagnosis not present

## 2017-07-29 DIAGNOSIS — Z79899 Other long term (current) drug therapy: Secondary | ICD-10-CM | POA: Diagnosis not present

## 2017-07-29 DIAGNOSIS — Z950 Presence of cardiac pacemaker: Secondary | ICD-10-CM | POA: Diagnosis not present

## 2017-07-29 DIAGNOSIS — I493 Ventricular premature depolarization: Secondary | ICD-10-CM | POA: Insufficient documentation

## 2017-07-29 DIAGNOSIS — G4089 Other seizures: Secondary | ICD-10-CM | POA: Diagnosis present

## 2017-07-29 LAB — BASIC METABOLIC PANEL
Anion gap: 9 (ref 5–15)
BUN: 13 mg/dL (ref 6–20)
CHLORIDE: 104 mmol/L (ref 101–111)
CO2: 21 mmol/L — AB (ref 22–32)
CREATININE: 0.91 mg/dL (ref 0.61–1.24)
Calcium: 9.4 mg/dL (ref 8.9–10.3)
GFR calc Af Amer: >60 mL/min (ref >60)
GFR calc non Af Amer: >60 mL/min (ref >60)
Glucose, Bld: 152 mg/dL — ABNORMAL HIGH (ref 65–99)
Potassium: 4 mmol/L (ref 3.5–5.1)
Sodium: 134 mmol/L — ABNORMAL LOW (ref 135–145)

## 2017-07-29 LAB — CBC
HCT: 40.8 % (ref 39.0–52.0)
Hemoglobin: 14.5 g/dL (ref 13.0–17.0)
MCH: 30.2 pg (ref 26.0–34.0)
MCHC: 35.5 g/dL (ref 30.0–36.0)
MCV: 85 fL (ref 78.0–100.0)
PLATELETS: 126 10*3/uL — AB (ref 150–400)
RBC: 4.8 MIL/uL (ref 4.22–5.81)
RDW: 12 % (ref 11.5–15.5)
WBC: 12.9 10*3/uL — ABNORMAL HIGH (ref 4.0–10.5)

## 2017-07-29 MED ORDER — SODIUM CHLORIDE 0.9 % IV SOLN: INTRAVENOUS | Status: DC

## 2017-07-29 MED ORDER — ONDANSETRON HCL 4 MG/2ML IJ SOLN
4.0000 mg | Freq: Once | INTRAMUSCULAR | Status: AC
  Administered 2017-07-29: 4 mg via INTRAVENOUS
  Filled 2017-07-29: qty 2

## 2017-07-29 MED ORDER — SODIUM CHLORIDE 0.9 % IV BOLUS (SEPSIS)
1000.0000 mL | Freq: Once | INTRAVENOUS | Status: AC
  Administered 2017-07-29: 1000 mL via INTRAVENOUS

## 2017-07-29 NOTE — ED Provider Notes (Signed)
[facility] EMERGENCY DEPARTMENT Provider Note   CSN: [id number] Arrival date & time: 07/29/17  1738     History   Chief Complaint Chief Complaint  Patient presents with  . Seizures    HPI John Myers is a 31 y.o. male.  HPI Pt was at work.  He suddenly started to feel like he was getting hot, and felt lightheaded, nauseated.  He felt like he was going to faint.  He started to get dizzy whenever her tried to sit up.  He continued to feel like he was going to vomit.  He was taken home from work.  At home he had an episode that he was staring off and shaking.  He would not respond.  It lasted 15-20 seconds.  The next thing he remembers is lying in the bed.  Within in seconds he started speaking. Past Medical History:  Diagnosis Date  . Allergic rhinitis   . Depression   . Dyspnea   . Finger injury   . Gilbert syndrome 2005  . Premature ventricular contractions   . Presence of permanent cardiac pacemaker   . Right ventricular dysfunction   . Sick sinus syndrome ([place]) 2014   s/p PPM  . TGA (transposition of great arteries)    surgical correction at [facility] at 60 months of age    Patient Active Problem List   Diagnosis Date Noted  . S/P shoulder surgery 03/26/2016  . BPPV (benign paroxysmal positional vertigo) 03/09/2015  . Sick sinus syndrome ([place]) 03/12/2014  . Vasovagal near syncope 03/12/2014  . Fatigue 03/12/2014  . Infection of index finger 07/08/2013  . Finger injury 07/08/2013  . Cardiac pacemaker   . TGA (transposition of great arteries)   . Allergic rhinitis 01/22/2013  . URI 11/18/2008  . DEPRESSION 05/19/2007    Past Surgical History:  Procedure Laterality Date  . CLOSED REDUCTION METACARPAL WITH PERCUTANEOUS PINNING Right 07/03/2016   Procedure: RIGHT HAND CLOSED REDUCTION AND PINNING;  Surgeon: Iran Planas, MD;  Location: [facility];  Service: Orthopedics;  Laterality: Right;  AXILLARY BLOCK WITH SEDATION  . PACEMAKER INSERTION  03/08/13    MDT Adapta SR (atrial) PPM implanted by Dr Geanie Kenning at [facility] for sick sinus syndrome  . SHOULDER ARTHROSCOPY WITH CAPSULORRHAPHY Left 03/26/2016   Procedure: LEFT SHOULDER ATHROSCOPY WITH DEBRIDEMENT, chromoplasty, and bursectomy.;  Surgeon: Sydnee Cabal, MD;  [facility];  Service: Orthopedics;  Laterality: Left;  ANESTHESIA: GENERAL/INTERSCALENE BLOCK  . SUBACROMIAL DECOMPRESSION Left 03/26/2016   Procedure: LEFT SUBACROMIAL DECOMPRESSION;  Surgeon: Sydnee Cabal, MD;  [facility];  Service: Orthopedics;  Laterality: Left;  . TRANSPOSITION OF GREAT VESSELS REPAIR     Senning procedure performed at 44 months of age at [facility].  . WISDOM TOOTH EXTRACTION         Home Medications    Prior to Admission medications   Medication Sig Start Date End Date Taking? Authorizing Provider  albuterol (PROVENTIL HFA;VENTOLIN HFA) 108 (90 Base) MCG/ACT inhaler Inhale 2 puffs into the lungs every 6 (six) hours as needed for wheezing or shortness of breath. 03/17/17  Yes Marletta Lor, MD  diphenhydrAMINE (BENADRYL) 25 mg capsule Take 25 mg by mouth every 6 (six) hours as needed for allergies.   Yes [provider]  azithromycin (ZITHROMAX) 250 MG tablet Take 1 tablet (250 mg total) by mouth daily. Take first 2 tablets together, then 1 every day until finished. Patient not taking: Reported on 07/29/2017 03/18/17   Veryl Speak,  MD  fluticasone (FLOVENT HFA) 110 MCG/ACT inhaler Inhale 2 puffs into the lungs daily. Patient not taking: Reported on 07/29/2017 03/17/17   Marletta Lor, MD    Family History Family History  Problem Relation Age of Onset  . Hypertension Unknown   . Hyperlipidemia Unknown   . CAD Unknown     Social History Social History   Tobacco Use  . Smoking status: Never Smoker  . Smokeless tobacco: Never Used  Substance Use Topics  . Alcohol use: No  . Drug use: No     Allergies   Patient has no known allergies.   Review of Systems Review of  Systems  All other systems reviewed and are negative.    Physical Exam Updated Vital Signs BP 116/77   Pulse 89   Temp 98.5 F (36.9 C) (Oral)   Resp 15   Ht 1.829 m (6')   Wt 71.7 kg (158 lb)   SpO2 97%   BMI 21.43 kg/m   Physical Exam  Constitutional: He appears well-developed and well-nourished. No distress.  HENT:  Head: Normocephalic and atraumatic.  Right Ear: External ear normal.  Left Ear: External ear normal.  Eyes: Conjunctivae are normal. Right eye exhibits no discharge. Left eye exhibits no discharge. No scleral icterus.  Neck: Neck supple. No tracheal deviation present.  Cardiovascular: Normal rate, regular rhythm and intact distal pulses.  Pulmonary/Chest: Effort normal and breath sounds normal. No stridor. No respiratory distress. He has no wheezes. He has no rales.  Abdominal: Soft. Bowel sounds are normal. He exhibits no distension. There is no tenderness. There is no rebound and no guarding.  Musculoskeletal: He exhibits no edema or tenderness.  Neurological: He is alert. He has normal strength. No cranial nerve deficit (no facial droop, extraocular movements intact, no slurred speech) or sensory deficit. He exhibits normal muscle tone. He displays no seizure activity. Coordination normal.  Skin: Skin is warm and dry. No rash noted.  Psychiatric: He has a normal mood and affect.  Nursing note and vitals reviewed.    ED Treatments / Results  Labs (all labs ordered are listed, but only abnormal results are displayed) Labs Reviewed  BASIC METABOLIC PANEL - Abnormal; Notable for the following components:      Result Value   Sodium 134 (*)    CO2 21 (*)    Glucose, Bld 152 (*)    All other components within normal limits  CBC - Abnormal; Notable for the following components:   WBC 12.9 (*)    Platelets 126 (*)    All other components within normal limits     EKG  EKG Interpretation  Date/Time:  Tuesday July 29 2017 19:58:06 EST Ventricular  Rate:  60 PR Interval:    QRS Duration: 104 QT Interval:  464 QTC Calculation: 464 R Axis:   106 Text Interpretation:  electronic atrial pacemaker Right ventricular hypertrophy Abnormal lateral Q waves No significant change since last tracing Confirmed by Dorie Rank [phone]) on 07/29/2017 8:02:58 PM       Radiology Ct Head Wo Contrast  Result Date: 07/29/2017 CLINICAL DATA:  31 y/o  M; patient reports seizure. EXAM: CT HEAD WITHOUT CONTRAST TECHNIQUE: Contiguous axial images were obtained from the base of the skull through the vertex without intravenous contrast. COMPARISON:  None. FINDINGS: Brain: No evidence of acute infarction, hemorrhage, hydrocephalus, extra-axial collection or mass lesion/mass effect. Vascular: No hyperdense vessel or unexpected calcification. Skull: Normal. Negative for fracture or focal lesion. Sinuses/Orbits: No  acute finding. Other: None. IMPRESSION: Normal CT of the head. Electronically Signed   By: Kristine Garbe M.D.   On: 07/29/2017 19:38    Procedures Procedures (including critical care time)  Medications Ordered in ED Medications  0.9 %  sodium chloride infusion (not administered)  ondansetron (ZOFRAN) injection 4 mg (4 mg Intravenous Given 07/29/17 1834)  sodium chloride 0.9 % bolus 1,000 mL (1,000 mLs Intravenous New Bag/Given 07/29/17 1834)  ondansetron (ZOFRAN) injection 4 mg (4 mg Intravenous Given 07/29/17 1909)     Initial Impression / Assessment and Plan / ED Course  I have reviewed the triage vital signs and the nursing notes.  Pertinent labs & imaging results that were available during my care of the patient were reviewed by me and considered in my medical decision making (see chart for details).  Clinical Course as of Jul 29 2021  Tue Jul 29, 2017  1906 Pt is starting to feel flushed again.  Vitals are stable.  Will give cool rags..  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  He did have shaking during the episode but there was no postictal phase and the duration was approximately 15 seconds.  Sounds more like a syncopal episode to me.  Patient was monitored in the emergency room.  Lab and EKG are reassuring.  His pacemaker was interrogated and there were no events this evening.  Patient feels like his symptoms are triggered by vertigo.  I think it is reasonable for him to follow-up with a neurologist for further evaluation.  At this time there does not appear to be any evidence of an acute emergency medical condition and the patient appears stable for discharge with appropriate outpatient follow up.   Final Clinical Impressions(s) / ED Diagnoses   Final diagnoses:  Syncope, unspecified syncope type    ED Discharge Orders    None       Dorie Rank, MD 07/29/17 2023

## 2017-07-29 NOTE — ED Notes (Signed)
Patient given discharge instructions and verbalized understanding.  Patient stable to discharge at this time.  Patient is alert and oriented to baseline.  No distressed noted at this time.  All belongings taken with the patient at discharge.   

## 2017-07-29 NOTE — ED Triage Notes (Signed)
Pt from home with new onset seizure like activity. Per EMS, pt came home from work and was complaining of dizziness and fatigue when he laid down for a nap. Pt's girlfriend reports when pt laid down he stared blankly ahead while clenching his arms close to his chest and convulsing. Pt A&Ox4 at this time, no postictal phase noted. Pt girlfriend reports episode lasted approx 15 secs. No incontinence. Pt denies HA. 20 G L arm. EMS VS: 121/71, 64 HR, 18 RR, 99-100% on RA. 117 CBG

## 2017-07-29 NOTE — Discharge Instructions (Signed)
Follow-up with your primary doctor and consider seeing a neurologist for further evaluation of the episode he had this evening and your vertigo

## 2017-08-05 DIAGNOSIS — M791 Myalgia, unspecified site: Secondary | ICD-10-CM | POA: Diagnosis not present

## 2017-08-05 DIAGNOSIS — R0789 Other chest pain: Secondary | ICD-10-CM | POA: Diagnosis not present

## 2017-08-05 DIAGNOSIS — Z950 Presence of cardiac pacemaker: Secondary | ICD-10-CM | POA: Diagnosis not present

## 2017-11-13 DIAGNOSIS — I495 Sick sinus syndrome: Secondary | ICD-10-CM | POA: Diagnosis not present

## 2017-11-13 DIAGNOSIS — Q203 Discordant ventriculoarterial connection: Secondary | ICD-10-CM | POA: Diagnosis not present

## 2017-12-18 DIAGNOSIS — R400 Somnolence: Secondary | ICD-10-CM | POA: Diagnosis not present

## 2017-12-18 DIAGNOSIS — Z9889 Other specified postprocedural states: Secondary | ICD-10-CM | POA: Diagnosis not present

## 2017-12-18 DIAGNOSIS — Q203 Discordant ventriculoarterial connection: Secondary | ICD-10-CM | POA: Diagnosis not present

## 2017-12-18 DIAGNOSIS — Q243 Pulmonary infundibular stenosis: Secondary | ICD-10-CM | POA: Diagnosis not present

## 2018-01-06 ENCOUNTER — Encounter (HOSPITAL_BASED_OUTPATIENT_CLINIC_OR_DEPARTMENT_OTHER): Payer: Self-pay

## 2018-01-06 DIAGNOSIS — R5383 Other fatigue: Secondary | ICD-10-CM

## 2018-01-12 DIAGNOSIS — M9901 Segmental and somatic dysfunction of cervical region: Secondary | ICD-10-CM | POA: Diagnosis not present

## 2018-01-12 DIAGNOSIS — M531 Cervicobrachial syndrome: Secondary | ICD-10-CM | POA: Diagnosis not present

## 2018-01-12 DIAGNOSIS — M5386 Other specified dorsopathies, lumbar region: Secondary | ICD-10-CM | POA: Diagnosis not present

## 2018-01-12 DIAGNOSIS — M9903 Segmental and somatic dysfunction of lumbar region: Secondary | ICD-10-CM | POA: Diagnosis not present

## 2018-01-19 DIAGNOSIS — M9901 Segmental and somatic dysfunction of cervical region: Secondary | ICD-10-CM | POA: Diagnosis not present

## 2018-01-19 DIAGNOSIS — M9903 Segmental and somatic dysfunction of lumbar region: Secondary | ICD-10-CM | POA: Diagnosis not present

## 2018-01-19 DIAGNOSIS — M5386 Other specified dorsopathies, lumbar region: Secondary | ICD-10-CM | POA: Diagnosis not present

## 2018-01-19 DIAGNOSIS — M531 Cervicobrachial syndrome: Secondary | ICD-10-CM | POA: Diagnosis not present

## 2018-01-30 ENCOUNTER — Encounter (HOSPITAL_BASED_OUTPATIENT_CLINIC_OR_DEPARTMENT_OTHER): Payer: BLUE CROSS/BLUE SHIELD

## 2018-06-30 ENCOUNTER — Encounter: Payer: Self-pay | Admitting: Adult Health

## 2018-06-30 ENCOUNTER — Ambulatory Visit (INDEPENDENT_AMBULATORY_CARE_PROVIDER_SITE_OTHER): Payer: BLUE CROSS/BLUE SHIELD

## 2018-06-30 ENCOUNTER — Ambulatory Visit (INDEPENDENT_AMBULATORY_CARE_PROVIDER_SITE_OTHER): Payer: BLUE CROSS/BLUE SHIELD | Admitting: Adult Health

## 2018-06-30 VITALS — BP 102/60 | Temp 98.2°F | Wt 158.0 lb

## 2018-06-30 DIAGNOSIS — M25532 Pain in left wrist: Secondary | ICD-10-CM

## 2018-06-30 MED ORDER — METHYLPREDNISOLONE 4 MG PO TBPK: ORAL_TABLET | ORAL | 0 refills | Status: DC

## 2018-06-30 NOTE — Progress Notes (Signed)
Subjective:    Patient ID: John Myers, male    DOB: [birth year], 32 y.o.   MRN: [id number]  HPI  32 year old male who  has a past medical history of Allergic rhinitis, Depression, Dyspnea, Finger injury, Gilbert syndrome (2005), Premature ventricular contractions, Presence of permanent cardiac pacemaker, Right ventricular dysfunction, Sick sinus syndrome ([place]) (2014), and TGA (transposition of great arteries).  He is patient of Dr. Raliegh Ip who I am seeing today for an acute issue of left wrist pain x 1 week. He reports he was lifting a piece of plywood when he felt a popping sensation in his left wrist. Pain has not resolved and has become worse as the week has gone on. Denies any bruising or swelling.   Review of Systems See HPI   Past Medical History:  Diagnosis Date  . Allergic rhinitis   . Depression   . Dyspnea   . Finger injury   . Gilbert syndrome 2005  . Premature ventricular contractions   . Presence of permanent cardiac pacemaker   . Right ventricular dysfunction   . Sick sinus syndrome ([place]) 2014   s/p PPM  . TGA (transposition of great arteries)    surgical correction at [facility] at 16 months of age    Social History   Socioeconomic History  . Marital status: Single    Spouse name: Not on file  . Number of children: Not on file  . Years of education: Not on file  . Highest education level: Not on file  Occupational History  . Not on file  Social Needs  . Financial resource strain: Not on file  . Food insecurity:    Worry: Not on file    Inability: Not on file  . Transportation needs:    Medical: Not on file    Non-medical: Not on file  Tobacco Use  . Smoking status: Never Smoker  . Smokeless tobacco: Never Used  Substance and Sexual Activity  . Alcohol use: No  . Drug use: No  . Sexual activity: Not on file  Lifestyle  . Physical activity:    Days per week: Not on file    Minutes per session: Not on file  . Stress: Not on file  Relationships  .  Social connections:    Talks on phone: Not on file    Gets together: Not on file    Attends religious service: Not on file    Active member of club or organization: Not on file    Attends meetings of clubs or organizations: Not on file    Relationship status: Not on file  . Intimate partner violence:    Fear of current or ex partner: Not on file    Emotionally abused: Not on file    Physically abused: Not on file    Forced sexual activity: Not on file  Other Topics Concern  . 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 CLOSED REDUCTION METACARPAL WITH PERCUTANEOUS PINNING Right 07/03/2016   Procedure: RIGHT HAND CLOSED REDUCTION AND PINNING;  Surgeon: Iran Planas, MD;  Location: [facility];  Service: Orthopedics;  Laterality: Right;  AXILLARY BLOCK WITH SEDATION  . PACEMAKER INSERTION  03/08/13   MDT Adapta SR (atrial) PPM implanted  by Dr Geanie Kenning at [facility] for sick sinus syndrome  . SHOULDER ARTHROSCOPY WITH CAPSULORRHAPHY Left 03/26/2016   Procedure: LEFT SHOULDER ATHROSCOPY WITH DEBRIDEMENT, chromoplasty, and bursectomy.;  Surgeon: Sydnee Cabal, MD;  [facility];  Service: Orthopedics;  Laterality: Left;  ANESTHESIA: GENERAL/INTERSCALENE BLOCK  . SUBACROMIAL DECOMPRESSION Left 03/26/2016   Procedure: LEFT SUBACROMIAL DECOMPRESSION;  Surgeon: Sydnee Cabal, MD;  [facility];  Service: Orthopedics;  Laterality: Left;  . TRANSPOSITION OF GREAT VESSELS REPAIR     Senning procedure performed at 33 months of age at [facility].  . WISDOM TOOTH EXTRACTION      Family History  Problem Relation Age of Onset  . Hypertension Unknown   . Hyperlipidemia Unknown   . CAD Unknown     No Known Allergies  Current Outpatient Medications on File Prior to Visit  Medication Sig Dispense Refill  .  diphenhydrAMINE (BENADRYL) 25 mg capsule Take 25 mg by mouth every 6 (six) hours as needed for allergies.     No current facility-administered medications on file prior to visit.     BP 102/60   Temp 98.2 F (36.8 C) (Oral)   Wt 158 lb (71.7 kg)   BMI 21.43 kg/m       Objective:   Physical Exam  Constitutional: He is oriented to person, place, and time. He appears well-developed and well-nourished. No distress.  Musculoskeletal: Normal range of motion. He exhibits tenderness. He exhibits no edema or deformity.  Pain with palpation along ulna side of wrist. No edema or bruising noted   Has full ROM and can move all digits of left hand.  Endorses pain with Empty can test. No pain with full can test. No popping or clicking noted   Neurological: He is alert and oriented to person, place, and time.  Skin: Skin is warm and dry. Capillary refill takes less than 2 seconds. He is not diaphoretic.  Psychiatric: He has a normal mood and affect. His behavior is normal. Judgment and thought content normal.  Nursing note and vitals reviewed.     Assessment & Plan:  1. Left wrist pain - Likely ligament or tendon strain  - Will start with Xray and possibly get CT? ( unable to get MRI d/t pacemaker). Also consider sending to orthopedics  - DG Wrist Complete Left; Future  Dorothyann Peng, NP

## 2018-07-02 ENCOUNTER — Other Ambulatory Visit: Payer: Self-pay | Admitting: Family Medicine

## 2018-07-02 ENCOUNTER — Telehealth: Payer: Self-pay | Admitting: Adult Health

## 2018-07-02 DIAGNOSIS — M25532 Pain in left wrist: Secondary | ICD-10-CM

## 2018-07-02 NOTE — Telephone Encounter (Signed)
Copied from [place] [id number]. Topic: Quick Communication - See Telephone Encounter >> Jul 02, 2018 11:42 AM Sheran Luz wrote: CRM for notification. See Telephone encounter for: 07/02/18.  Pt calling to check status of imaging results from 10/22. Please advise.

## 2018-07-02 NOTE — Telephone Encounter (Signed)
Contacted pt regarding imaging results; conference call initiated with Mendel Ryder at [facility]; [place] gave the pt results of x-ray per Dorothyann Peng; he verbalized understanding; unable to chart in result note because no encounter created.

## 2018-07-17 ENCOUNTER — Ambulatory Visit (INDEPENDENT_AMBULATORY_CARE_PROVIDER_SITE_OTHER): Payer: Self-pay | Admitting: Orthopaedic Surgery

## 2018-11-16 ENCOUNTER — Ambulatory Visit (INDEPENDENT_AMBULATORY_CARE_PROVIDER_SITE_OTHER): Payer: BLUE CROSS/BLUE SHIELD

## 2018-11-16 ENCOUNTER — Ambulatory Visit (INDEPENDENT_AMBULATORY_CARE_PROVIDER_SITE_OTHER): Payer: BLUE CROSS/BLUE SHIELD | Admitting: Internal Medicine

## 2018-11-16 ENCOUNTER — Encounter: Payer: Self-pay | Admitting: Internal Medicine

## 2018-11-16 VITALS — BP 120/62 | HR 64 | Temp 98.6°F | Wt 156.5 lb

## 2018-11-16 DIAGNOSIS — R6889 Other general symptoms and signs: Secondary | ICD-10-CM

## 2018-11-16 DIAGNOSIS — Z20828 Contact with and (suspected) exposure to other viral communicable diseases: Secondary | ICD-10-CM | POA: Diagnosis not present

## 2018-11-16 DIAGNOSIS — J111 Influenza due to unidentified influenza virus with other respiratory manifestations: Secondary | ICD-10-CM | POA: Diagnosis not present

## 2018-11-16 DIAGNOSIS — Z87898 Personal history of other specified conditions: Secondary | ICD-10-CM | POA: Diagnosis not present

## 2018-11-16 DIAGNOSIS — Q203 Discordant ventriculoarterial connection: Secondary | ICD-10-CM | POA: Diagnosis not present

## 2018-11-16 LAB — POCT INFLUENZA A/B
Influenza A, POC: NEGATIVE
Influenza B, POC: NEGATIVE

## 2018-11-16 MED ORDER — OSELTAMIVIR PHOSPHATE 75 MG PO CAPS: 75.0000 mg | ORAL_CAPSULE | Freq: Two times a day (BID) | ORAL | 0 refills | Status: DC

## 2018-11-16 NOTE — Patient Instructions (Addendum)
tamiflu can decrease days of fever and viral replication  If influenza.   Your flu screen is negative but can be a false negative about  20 - 30 % of the time and still have flu .   X ray is stable   Your exam if reassuring but cannot explain  The numbness  Episodes    If ongoing  progressive seen fu care emergent   Or if any weakness of persistent fever   Please  Establish with new PCP provider.  And please get FU visit with cardiology  as you are over due for check.     Influenza, Adult Influenza, more commonly known as "the flu," is a viral infection that mainly affects the respiratory tract. The respiratory tract includes organs that help you breathe, such as the lungs, nose, and throat. The flu causes many symptoms similar to the common cold along with high fever and body aches. The flu spreads easily from person to person (is contagious). Getting a flu shot (influenza vaccination) every year is the best way to prevent the flu. What are the causes? This condition is caused by the influenza virus. You can get the virus by:  Breathing in droplets that are in the air from an infected person's cough or sneeze.  Touching something that has been exposed to the virus (has been contaminated) and then touching your mouth, nose, or eyes. What increases the risk? The following factors may make you more likely to get the flu:  Not washing or sanitizing your hands often.  Having close contact with many people during cold and flu season.  Touching your mouth, eyes, or nose without first washing or sanitizing your hands.  Not getting a yearly (annual) flu shot. You may have a higher risk for the flu, including serious problems such as a lung infection (pneumonia), if you:  Are older than 65.  Are pregnant.  Have a weakened disease-fighting system (immune system). You may have a weakened immune system if you: ? Have HIV or AIDS. ? Are undergoing chemotherapy. ? Are taking medicines  that reduce (suppress) the activity of your immune system.  Have a long-term (chronic) illness, such as heart disease, kidney disease, diabetes, or lung disease.  Have a liver disorder.  Are severely overweight (morbidly obese).  Have anemia. This is a condition that affects your red blood cells.  Have asthma. What are the signs or symptoms? Symptoms of this condition usually begin suddenly and last 4-14 days. They may include:  Fever and chills.  Headaches, body aches, or muscle aches.  Sore throat.  Cough.  Runny or stuffy (congested) nose.  Chest discomfort.  Poor appetite.  Weakness or fatigue.  Dizziness.  Nausea or vomiting. How is this diagnosed? This condition may be diagnosed based on:  Your symptoms and medical history.  A physical exam.  Swabbing your nose or throat and testing the fluid for the influenza virus. How is this treated? If the flu is diagnosed early, you can be treated with medicine that can help reduce how severe the illness is and how long it lasts (antiviral medicine). This may be given by mouth (orally) or through an IV. Taking care of yourself at home can help relieve symptoms. Your health care provider may recommend:  Taking over-the-counter medicines.  Drinking plenty of fluids. In many cases, the flu goes away on its own. If you have severe symptoms or complications, you may be treated in a hospital. Follow these instructions at home:  Activity  Rest as needed and get plenty of sleep.  Stay home from work or school as told by your health care provider. Unless you are visiting your health care provider, avoid leaving home until your fever has been gone for 24 hours without taking medicine. Eating and drinking  Take an oral rehydration solution (ORS). This is a drink that is sold at pharmacies and retail stores.  Drink enough fluid to keep your urine pale yellow.  Drink clear fluids in small amounts as you are able. Clear  fluids include water, ice chips, diluted fruit juice, and low-calorie sports drinks.  Eat bland, easy-to-digest foods in small amounts as you are able. These foods include bananas, applesauce, rice, lean meats, toast, and crackers.  Avoid drinking fluids that contain a lot of sugar or caffeine, such as energy drinks, regular sports drinks, and soda.  Avoid alcohol.  Avoid spicy or fatty foods. General instructions      Take over-the-counter and prescription medicines only as told by your health care provider.  Use a cool mist humidifier to add humidity to the air in your home. This can make it easier to breathe.  Cover your mouth and nose when you cough or sneeze.  Wash your hands with soap and water often, especially after you cough or sneeze. If soap and water are not available, use alcohol-based hand sanitizer.  Keep all follow-up visits as told by your health care provider. This is important. How is this prevented?   Get an annual flu shot. You may get the flu shot in late summer, fall, or winter. Ask your health care provider when you should get your flu shot.  Avoid contact with people who are sick during cold and flu season. This is generally fall and winter. Contact a health care provider if:  You develop new symptoms.  You have: ? Chest pain. ? Diarrhea. ? A fever.  Your cough gets worse.  You produce more mucus.  You feel nauseous or you vomit. Get help right away if:  You develop shortness of breath or difficulty breathing.  Your skin or nails turn a bluish color.  You have severe pain or stiffness in your neck.  You develop a sudden headache or sudden pain in your face or ear.  You cannot eat or drink without vomiting. Summary  Influenza, more commonly known as "the flu," is a viral infection that primarily affects your respiratory tract.  Symptoms of the flu usually begin suddenly and last 4-14 days.  Getting an annual flu shot is the best way  to prevent getting the flu.  Stay home from work or school as told by your health care provider. Unless you are visiting your health care provider, avoid leaving home until your fever has been gone for 24 hours without taking medicine.  Keep all follow-up visits as told by your health care provider. This is important. This information is not intended to replace advice given to you by your health care provider. Make sure you discuss any questions you have with your health care provider. Document Released: 08/23/2000 Document Revised: 02/11/2018 Document Reviewed: 02/11/2018 Elsevier Interactive Patient Education  2019 [employer].

## 2018-11-16 NOTE — Progress Notes (Signed)
Chief Complaint  Patient presents with  . Headache    x2 days pt states he had a cough on friday and a fever for a couple days. Pt states that he has pain in his right arm and has been exposed to the flu     HPI: John Myers 33 y.o. 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  2 days ago  He has  Little cough and felt bad and had temp 100.3 and then cough better but congested  in head .  No shortness of breath no fever yesterday but still has significant decrease in appetite.  No vomiting diarrhea unusual rash.  Right arm going numb.  Comes and oges.  No pain .  Lasts about 5 minutes cannot ascertain if it is only part of his arm no neck pain no other neurologic signs he is right-handed Has not happened today. Right handed sleeping .   No flu vaccine.  Because he thinks it causes illness and the flu. ROS: See pertinent positives and negatives per HPI.  Followed peds cards  [facility] ? Has corrected tga sp senning  ?And has pacer no recent dental work because he did not have [employer] but he does get antibiotic when he has dental work prophylaxis.  Past Medical History:  Diagnosis Date  . Allergic rhinitis   . Depression   . Dyspnea   . Finger injury   . Gilbert syndrome 2005  . Premature ventricular contractions   . Presence of permanent cardiac pacemaker   . Right ventricular dysfunction   . Sick sinus syndrome ([place]) 2014   s/p PPM  . TGA (transposition of great arteries)    surgical correction at [facility] at 15 months of age    Family History  Problem Relation Age of Onset  . Hypertension Unknown   . Hyperlipidemia Unknown   . CAD Unknown     Social History   Socioeconomic History  . Marital status: Single    Spouse name: Not on file  . Number of children: Not on file  . Years of education: Not on file  . Highest  education level: Not on file  Occupational History  . Not on file  Social Needs  . Financial resource strain: Not on file  . Food insecurity:    Worry: Not on file    Inability: Not on file  . Transportation needs:    Medical: Not on file    Non-medical: Not on file  Tobacco Use  . Smoking status: Never Smoker  . Smokeless tobacco: Never Used  Substance and Sexual Activity  . Alcohol use: No  . Drug use: No  . Sexual activity: Not on file  Lifestyle  . Physical activity:    Days per week: Not on file    Minutes per session: Not on file  . Stress: Not on file  Relationships  . Social connections:    Talks on phone: Not on file    Gets together: Not on file    Attends religious service: Not on file    Active member of club or organization: Not on file    Attends meetings of clubs or organizations: Not on file    Relationship status: Not on file  Other Topics Concern  .  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 diphenhydrAMINE (BENADRYL) 25 mg capsule Take 25 mg by mouth every 6 (six) hours as needed for allergies.    . methylPREDNISolone (MEDROL DOSEPAK) 4 MG TBPK tablet Take as directed 21 tablet 0   No facility-administered medications prior to visit.      EXAM:  BP 120/62 (BP Location: Right Arm, Patient Position: Sitting, Cuff Size: Normal)   Pulse 64   Temp 98.6 F (37 C) (Oral)   Wt 156 lb 8 oz (71 kg)   SpO2 97%   BMI 21.23 kg/m   Body mass index is 21.23 kg/m. WDWN in NAD  quiet respirations; mildly congested  somewhat hoarse. Non toxic .  But looks like he does not feel very well HEENT: Normocephalic ;atraumatic , Eyes;  PERRL, EOMs  Full, lids and conjunctiva clear,,Ears: no deformities, canals nl, TM landmarks normal, Nose: no  deformity or discharge but congested;face minimally tender Mouth : OP clear without lesion or edema . Neck: Supple without adenopathy or masses or bruits Chest:  Clear to A&P without wheezes rales or rhonchi Abdomen soft without organomegaly guarding or rebound. CV:  S1-S2 no gallops pulses appear to be equal there is a 2/6 systolic lung murmur heard best in the supine.  No gallops.  Peripheral perfusion is normal Skin :nl perfusion and no acute rashes  Neurologic appears normal normal upper body strength DTRs with no numbness or atrophy.  BP Readings from Last 3 Encounters:  11/16/18 120/62  06/30/18 102/60  07/29/17 128/73  c xray NAD   ASSESSMENT AND PLAN:  Discussed the following assessment and plan:  Flu-like symptoms - Plan: DG Chest 2 View, POC Influenza A/B  TGA (transposition of great arteries) - Plan: DG Chest 2 View  History of numbness - right arm off and on last 5 min no assioc sx weakness pain  had lfu like illness and exposures   Exposure to the flu High risk   Documented exposure mild sx at this time  Cannot explain the numbness episodes of 5 minutes with out weakness or pain .  Hx of surgery and shoulder  issues on left.  Declines flu vaccine  Feels could give flu .   Disc  Stay away from  Exposures .  Over due for fu cards with [facility] cards.  Etc disc  Establishing with new doc .  Expectant management.  -Patient advised to return or notify health care team  if  new concerns arise.  Patient Instructions  tamiflu can decrease days of fever and viral replication  If influenza.   Your flu screen is negative but can be a false negative about  20 - 30 % of the time and still have flu .   X ray is stable   Your exam if reassuring but cannot explain  The numbness  Episodes    If ongoing  progressive seen fu care emergent   Or if any weakness of persistent fever   Please  Establish with new PCP provider.  And please get FU visit with cardiology  as you are over due  for check.     Influenza, Adult Influenza, more commonly known as "the flu," is a viral infection  that mainly affects the respiratory tract. The respiratory tract includes organs that help you breathe, such as the lungs, nose, and throat. The flu causes many symptoms similar to the common cold along with high fever and body aches. The flu spreads easily from person to person (is contagious). Getting a flu shot (influenza vaccination) every year is the best way to prevent the flu. What are the causes? This condition is caused by the influenza virus. You can get the virus by:  Breathing in droplets that are in the air from an infected person's cough or sneeze.  Touching something that has been exposed to the virus (has been contaminated) and then touching your mouth, nose, or eyes. What increases the risk? The following factors may make you more likely to get the flu:  Not washing or sanitizing your hands often.  Having close contact with many people during cold and flu season.  Touching your mouth, eyes, or nose without first washing or sanitizing your hands.  Not getting a yearly (annual) flu shot. You may have a higher risk for the flu, including serious problems such as a lung infection (pneumonia), if you:  Are older than 65.  Are pregnant.  Have a weakened disease-fighting system (immune system). You may have a weakened immune system if you: ? Have HIV or AIDS. ? Are undergoing chemotherapy. ? Are taking medicines that reduce (suppress) the activity of your immune system.  Have a long-term (chronic) illness, such as heart disease, kidney disease, diabetes, or lung disease.  Have a liver disorder.  Are severely overweight (morbidly obese).  Have anemia. This is a condition that affects your red blood cells.  Have asthma. What are the signs or symptoms? Symptoms of this condition usually begin suddenly and last 4-14 days. They may include:  Fever and  chills.  Headaches, body aches, or muscle aches.  Sore throat.  Cough.  Runny or stuffy (congested) nose.  Chest discomfort.  Poor appetite.  Weakness or fatigue.  Dizziness.  Nausea or vomiting. How is this diagnosed? This condition may be diagnosed based on:  Your symptoms and medical history.  A physical exam.  Swabbing your nose or throat and testing the fluid for the influenza virus. How is this treated? If the flu is diagnosed early, you can be treated with medicine that can help reduce how severe the illness is and how long it lasts (antiviral medicine). This may be given by mouth (orally) or through an IV. Taking care of yourself at home can help relieve symptoms. Your health care provider may recommend:  Taking over-the-counter medicines.  Drinking plenty of fluids. In many cases, the flu goes away on its own. If you have severe symptoms or complications, you may be treated in a hospital. Follow these instructions at home: Activity  Rest as needed and get plenty of sleep.  Stay home from work or school as told by your health care provider. Unless you are visiting your health care provider, avoid leaving home until your fever has been gone for 24 hours without taking medicine. Eating and drinking  Take an oral rehydration solution (ORS). This is a drink that is sold at pharmacies and retail stores.  Drink enough fluid to keep your urine pale yellow.  Drink clear fluids in small amounts as you are able. Clear fluids include water, ice chips, diluted fruit juice, and low-calorie sports drinks.  Eat bland, easy-to-digest foods in small amounts as you are able. These foods include bananas, applesauce, rice,  lean meats, toast, and crackers.  Avoid drinking fluids that contain a lot of sugar or caffeine, such as energy drinks, regular sports drinks, and soda.  Avoid alcohol.  Avoid spicy or fatty foods. General instructions      Take over-the-counter and  prescription medicines only as told by your health care provider.  Use a cool mist humidifier to add humidity to the air in your home. This can make it easier to breathe.  Cover your mouth and nose when you cough or sneeze.  Wash your hands with soap and water often, especially after you cough or sneeze. If soap and water are not available, use alcohol-based hand sanitizer.  Keep all follow-up visits as told by your health care provider. This is important. How is this prevented?   Get an annual flu shot. You may get the flu shot in late summer, fall, or winter. Ask your health care provider when you should get your flu shot.  Avoid contact with people who are sick during cold and flu season. This is generally fall and winter. Contact a health care provider if:  You develop new symptoms.  You have: ? Chest pain. ? Diarrhea. ? A fever.  Your cough gets worse.  You produce more mucus.  You feel nauseous or you vomit. Get help right away if:  You develop shortness of breath or difficulty breathing.  Your skin or nails turn a bluish color.  You have severe pain or stiffness in your neck.  You develop a sudden headache or sudden pain in your face or ear.  You cannot eat or drink without vomiting. Summary  Influenza, more commonly known as "the flu," is a viral infection that primarily affects your respiratory tract.  Symptoms of the flu usually begin suddenly and last 4-14 days.  Getting an annual flu shot is the best way to prevent getting the flu.  Stay home from work or school as told by your health care provider. Unless you are visiting your health care provider, avoid leaving home until your fever has been gone for 24 hours without taking medicine.  Keep all follow-up visits as told by your health care provider. This is important. This information is not intended to replace advice given to you by your health care provider. Make sure you discuss any questions you have  with your health care provider. Document Released: 08/23/2000 Document Revised: 02/11/2018 Document Reviewed: 02/11/2018 Elsevier Interactive Patient Education  2019 Shandon K. Naylah Cork M.D.

## 2019-03-07 IMAGING — DX DG CHEST 2V
2 series · 2 of 2 positions shown · non-contrast
Comparison: 04/28/2013

CLINICAL DATA: Increasing shortness of breath for 2-3 weeks

EXAM:
CHEST  2 VIEW

[chest pa]
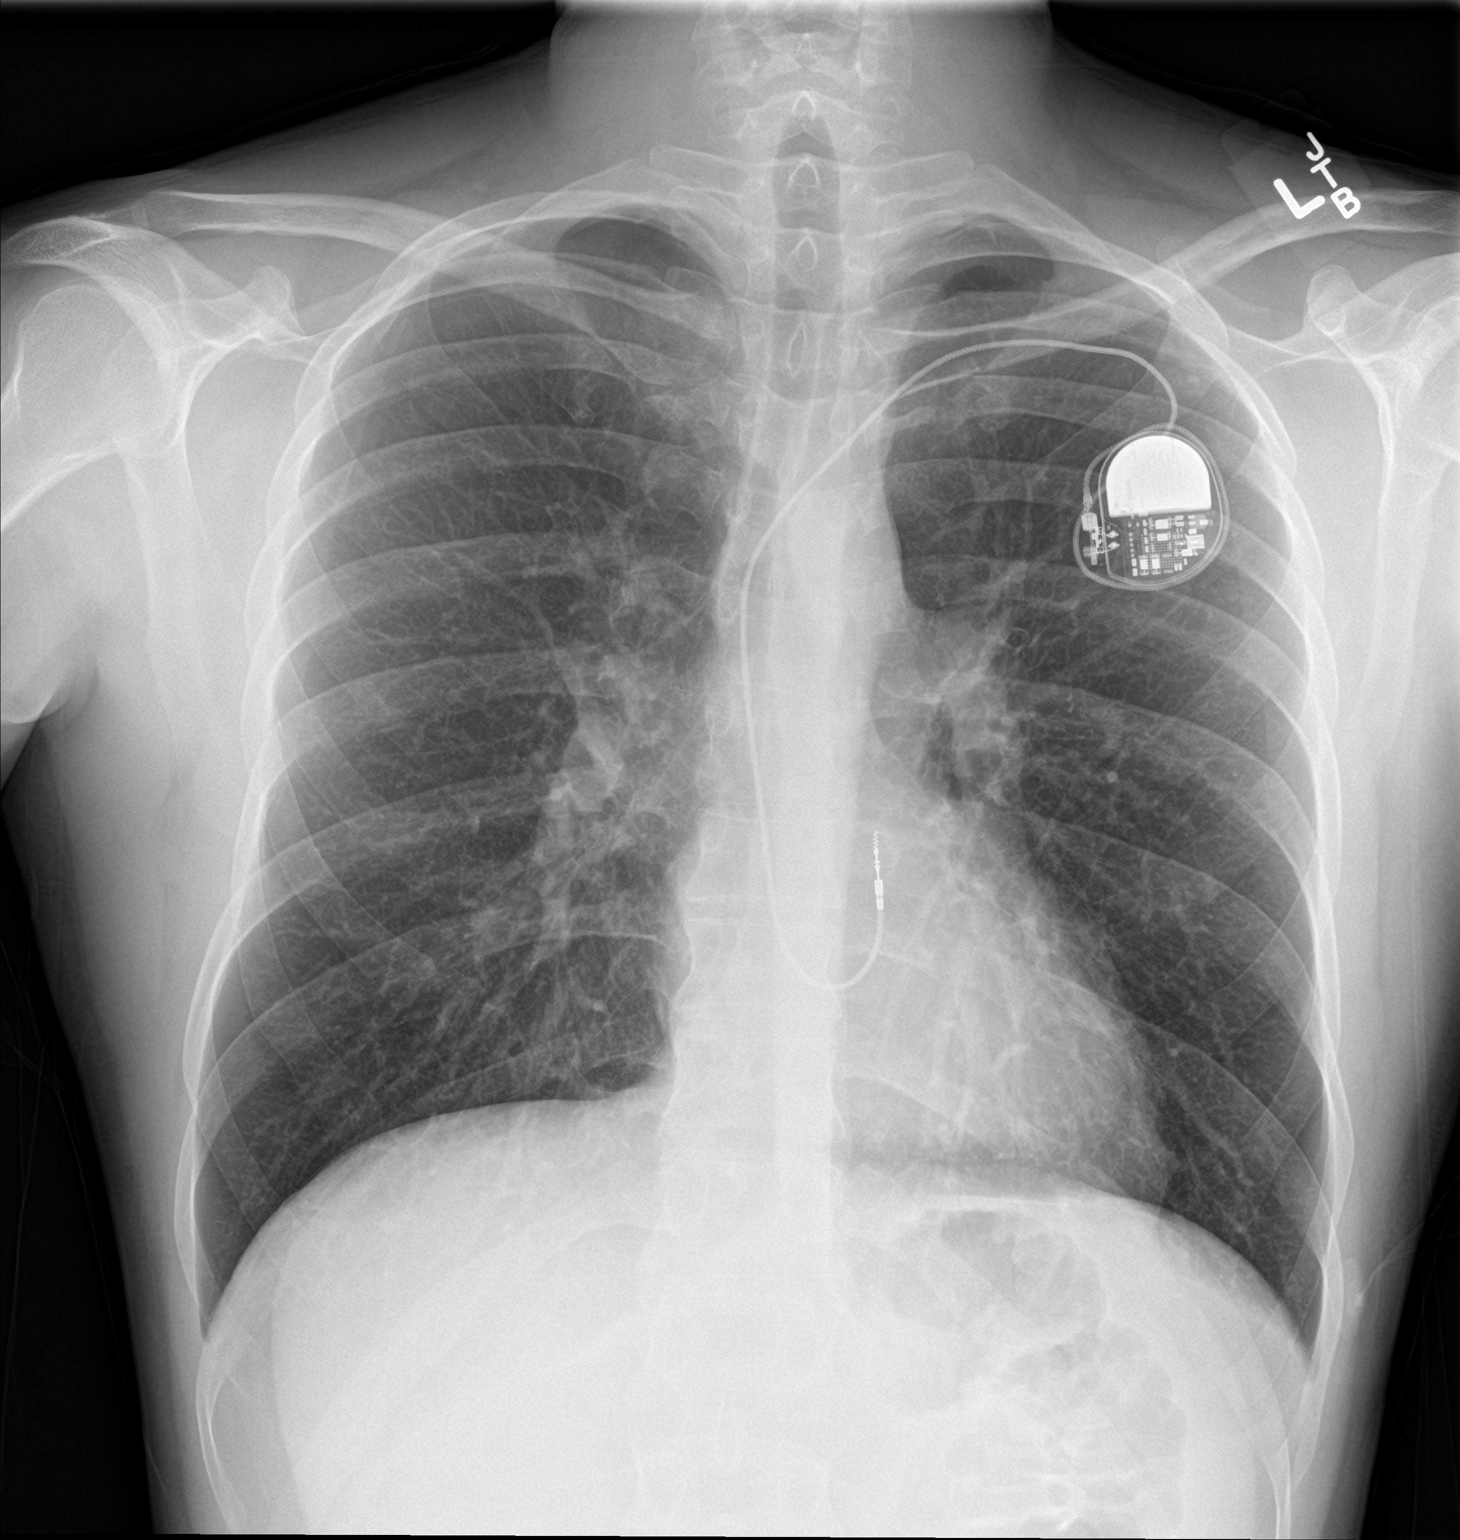

[chest lat]
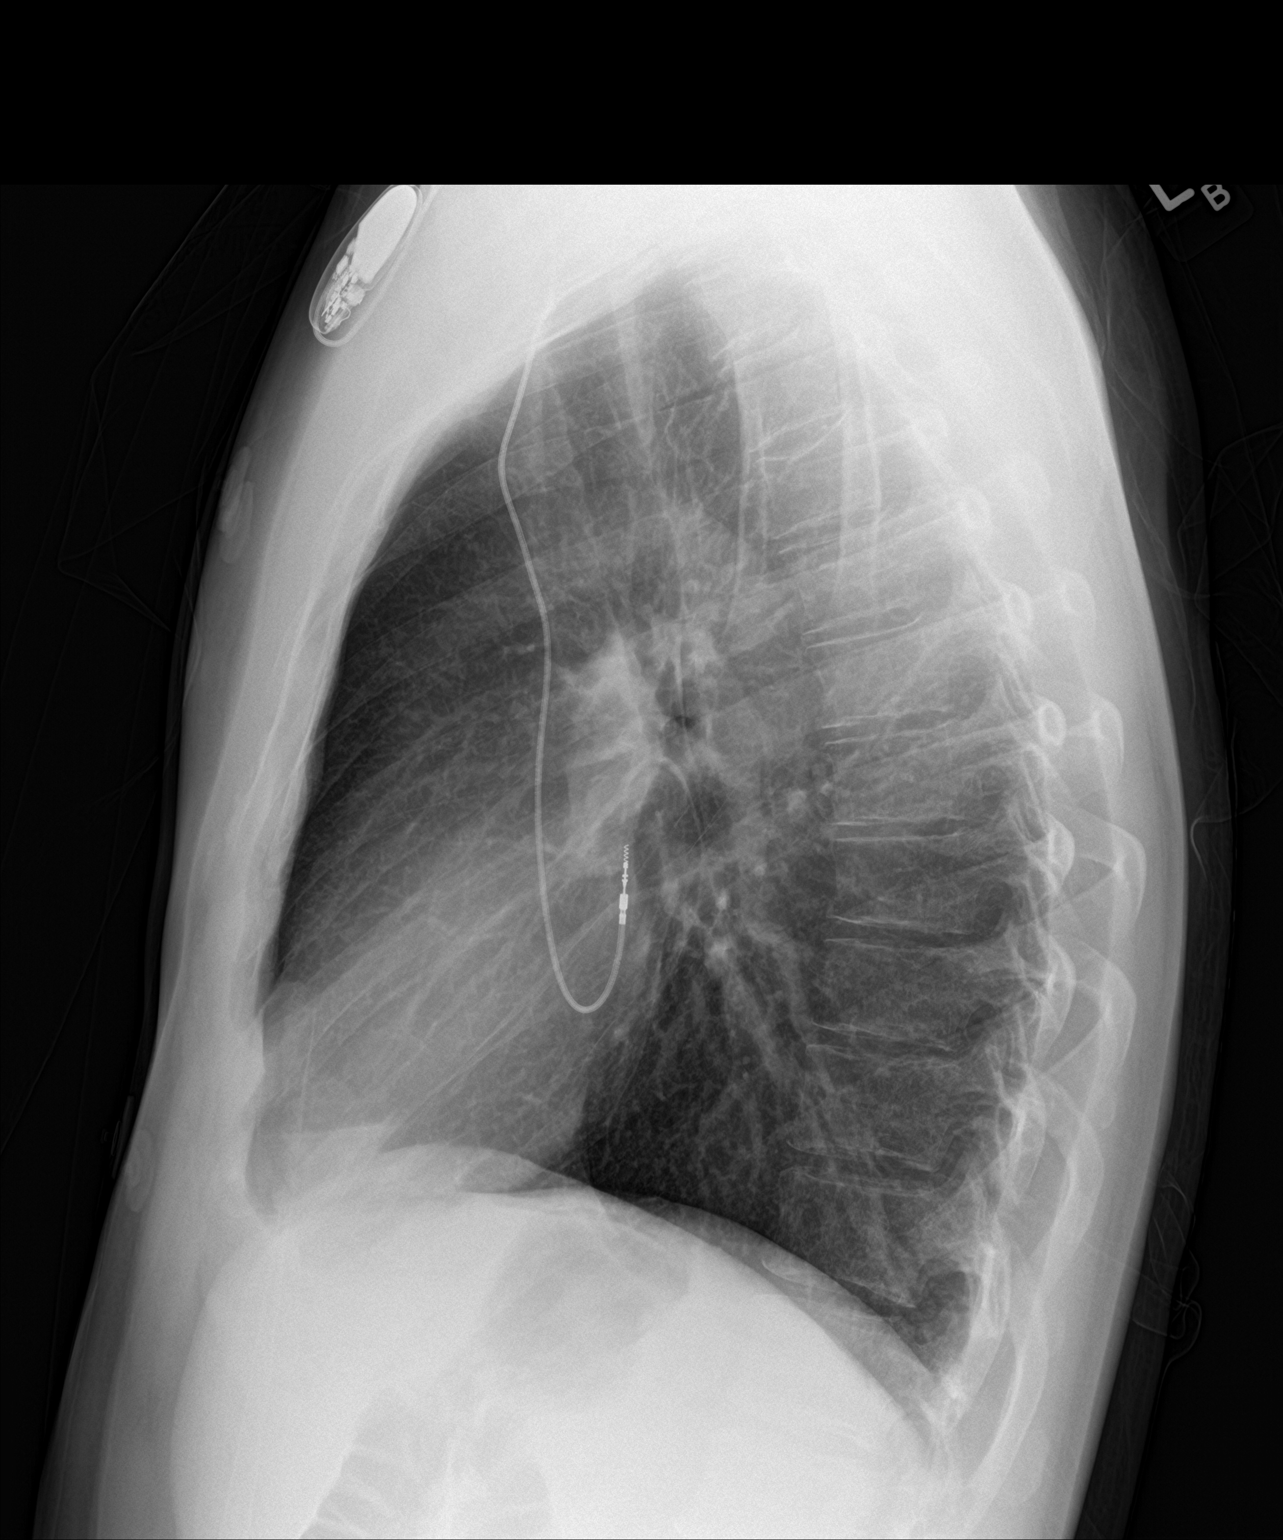

[2 of 2 positions shown; findings below may reference images not displayed]

FINDINGS: Left-sided single chamber pacer with somewhat high position. Patient
has history of transposition of great arteries. Normal heart size
and unremarkable mediastinal contours. There is no edema,
consolidation, effusion, or pneumothorax. Remote superior endplate
fracture of T4.
IMPRESSION: Stable chest.  No evidence of active disease.

## 2019-04-19 DIAGNOSIS — Z03818 Encounter for observation for suspected exposure to other biological agents ruled out: Secondary | ICD-10-CM | POA: Diagnosis not present

## 2019-04-26 ENCOUNTER — Ambulatory Visit: Payer: BC Managed Care – PPO | Admitting: Family Medicine

## 2019-06-01 ENCOUNTER — Telehealth: Payer: Self-pay | Admitting: Family Medicine

## 2019-06-01 NOTE — Telephone Encounter (Signed)
I received a call from the patient engagement center that there was an upset patient that wanted to speak to someone because of an appointment issue. I took the call and introduced myself.  The patient stated that "He and his boss made an appointment for 2:30pm for 06/03/2019 and now it said 10:40am." I explained that "I do not see where the appointment was edited and that Hoffman, our practice administrator scheduled it." As I looked further in staff messages, Debe Coder is who asked for the appointment to be scheduled which again was for 06/03/2019 for 10:40am.   I "apologized for the misunderstanding or the confusion about the appointment" and stated "I would like to help." The patient said "he was coming at 2:30pm", I told him "that is not an option as Dr. Yong Channel is booked that day and would not be able to see him however, I would be happy to see if I could get him worked in with another provider to transfer care in an afternoon time if he would like." He declined and continued to summarize how "this was our fault and he was not coming at 10:40am." I asked "if I needed to cancel the appointment if he is unable to make it and for him to contact us when he is able to reschedule", he yelled "No".   I explained to the patient that "I was offering every option possible to make up for this mishap however, I am unable to help if he continues to yell and not work with me." I placed the call on hold after he agreed to holding a moment and I attempted to reach Lea by phone to assist with the call and she was unavailable.   I asked the patient "if he would like to provide the best number for Lea, our practice administrator to call him back to see if she can help explain the options and accomodate." He said "he is too busy and was driving." I explained that "she could leave a detailed message and he could call back when possible." He declined.   He asked "If we are paying him for the time missed from work and I replied  unfortunately that is not an option." I "apologized for the inconviencne and that I can not provide the solution he wants and asked what can I do further?"  He said "he would come at 10:40am to the appointment."   I confirmed he said he would come and he agreed. The call ended.

## 2019-06-01 NOTE — Telephone Encounter (Signed)
Thanks for letting me know. Will not call patient back per patient request.

## 2019-06-03 ENCOUNTER — Ambulatory Visit (INDEPENDENT_AMBULATORY_CARE_PROVIDER_SITE_OTHER): Payer: BC Managed Care – PPO | Admitting: Family Medicine

## 2019-06-03 ENCOUNTER — Other Ambulatory Visit: Payer: Self-pay

## 2019-06-03 ENCOUNTER — Encounter: Payer: Self-pay | Admitting: Family Medicine

## 2019-06-03 VITALS — BP 118/78 | HR 51 | Temp 98.4°F | Ht 72.0 in | Wt 150.8 lb

## 2019-06-03 DIAGNOSIS — Q203 Discordant ventriculoarterial connection: Secondary | ICD-10-CM

## 2019-06-03 DIAGNOSIS — Z0000 Encounter for general adult medical examination without abnormal findings: Secondary | ICD-10-CM

## 2019-06-03 DIAGNOSIS — I495 Sick sinus syndrome: Secondary | ICD-10-CM | POA: Diagnosis not present

## 2019-06-03 DIAGNOSIS — N529 Male erectile dysfunction, unspecified: Secondary | ICD-10-CM

## 2019-06-03 DIAGNOSIS — Z118 Encounter for screening for other infectious and parasitic diseases: Secondary | ICD-10-CM

## 2019-06-03 DIAGNOSIS — Z114 Encounter for screening for human immunodeficiency virus [HIV]: Secondary | ICD-10-CM

## 2019-06-03 DIAGNOSIS — Z113 Encounter for screening for infections with a predominantly sexual mode of transmission: Secondary | ICD-10-CM

## 2019-06-03 DIAGNOSIS — Z13220 Encounter for screening for lipoid disorders: Secondary | ICD-10-CM

## 2019-06-03 NOTE — Patient Instructions (Addendum)
Health Maintenance Due  Topic Date Due  . HIV Screening - opts in to full std screening when comes back for labs 06/01/2001  . INFLUENZA VACCINE -pt declines 06/03/2019 04/10/2019   Schedule a lab visit at the check out desk within 2 weeks between 8-9 AM. Return for future fasting labs meaning nothing but water after midnight please. Ok to take your medications with water.   If testosterone low- may consider endocrine referral. If testosterone normal- can consider ED meds- would need to run either by [facility].

## 2019-06-03 NOTE — Progress Notes (Addendum)
Phone: [phone]   Subjective:  Patient presents today to establish care with me as their new primary care provider. Patient was formerly a patient of Dr. Burnice Logan.  Chief Complaint  Patient presents with  . est care- physical and ED   See problem oriented charting Review of Systems  Constitutional: Negative for chills and fever.  HENT: Negative for hearing loss and tinnitus.   Eyes: Negative for blurred vision and double vision.  Respiratory: Positive for shortness of breath (mild long term). Negative for cough and hemoptysis.   Cardiovascular: Positive for palpitations. Negative for chest pain.  Gastrointestinal: Negative for abdominal pain, constipation, diarrhea, heartburn, nausea and vomiting.  Genitourinary: Negative for dysuria and urgency.  Musculoskeletal: Negative for myalgias and neck pain.  Skin: Negative for itching and rash.  Neurological: Positive for headaches. Negative for tremors.  Endo/Heme/Allergies: Positive for environmental allergies. Does not bruise/bleed easily.  Psychiatric/Behavioral: Negative for depression, hallucinations, substance abuse and suicidal ideas.     The following were reviewed and entered/updated in epic: Past Medical History:  Diagnosis Date  . Allergic rhinitis   . Depression    never treated with meds   . Dyspnea   . Finger injury   . Gilbert syndrome 2005  . Premature ventricular contractions   . Presence of permanent cardiac pacemaker   . Right ventricular dysfunction   . Sick sinus syndrome ([place]) 2014   s/p PPM  . TGA (transposition of great arteries)    surgical correction at [facility] at 80 months of age   Patient Active Problem List   Diagnosis Date Noted  . Sick sinus syndrome ([place]) 03/12/2014    Priority: High  . TGA (transposition of great arteries)     Priority: High  . BPPV (benign paroxysmal positional vertigo) 03/09/2015    Priority: Low  . Allergic rhinitis 01/22/2013    Priority: Low  . Rosanna Randy syndrome  2005   Past Surgical History:  Procedure Laterality Date  . CLOSED REDUCTION METACARPAL WITH PERCUTANEOUS PINNING Right 07/03/2016   Procedure: RIGHT HAND CLOSED REDUCTION AND PINNING;  Surgeon: Iran Planas, MD;  Location: [place];  Service: Orthopedics;  Laterality: Right;  AXILLARY BLOCK WITH SEDATION  . PACEMAKER INSERTION  03/08/13   MDT Adapta SR (atrial) PPM implanted by Dr Geanie Kenning at [facility] for sick sinus syndrome  . SHOULDER ARTHROSCOPY WITH CAPSULORRHAPHY Left 03/26/2016   Procedure: LEFT SHOULDER ATHROSCOPY WITH DEBRIDEMENT, chromoplasty, and bursectomy.;  Surgeon: Sydnee Cabal, MD;  [facility];  Service: Orthopedics;  Laterality: Left;  ANESTHESIA: GENERAL/INTERSCALENE BLOCK  . SUBACROMIAL DECOMPRESSION Left 03/26/2016   Procedure: LEFT SUBACROMIAL DECOMPRESSION;  Surgeon: Sydnee Cabal, MD;  [facility];  Service: Orthopedics;  Laterality: Left;  . TRANSPOSITION OF GREAT VESSELS REPAIR     Senning procedure performed at 30 months of age at [facility].  . WISDOM TOOTH EXTRACTION      Family History  Problem Relation Age of Onset  . Colon cancer Mother   . Healthy Father   . Healthy Sister   . Healthy Brother   . Heart attack Paternal Grandfather        in late 20s or 61s  . Hyperlipidemia Paternal Grandfather   . Hypertension Paternal Grandfather   . Breast cancer Maternal Grandmother     Medications- reviewed and updated Current Outpatient Medications  Medication Sig Dispense Refill  . diphenhydrAMINE (BENADRYL) 25 mg capsule Take 25 mg by mouth every 6 (six) hours as needed for allergies.    . sildenafil (  REVATIO) 20 MG tablet Take 1-2 tablets as needed for erectile dysfunction. 25 tablet 3   No current facility-administered medications for this visit.     Allergies-reviewed and updated No Known Allergies  Social History   Social History Narrative   Single. 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 Oropharynx normal Neck: no thyromegaly CV: RRR, stable murmur Lungs: CTAB no crackles, wheeze, rhonchi Abdomen: soft/nontender/nondistended/normal bowel sounds. No rebound or guarding.  Ext: no edema Skin: warm, dry Neuro: grossly normal, moves all extremities, PERRLA    Assessment and Plan:  33 y.o. male presenting for annual physical.  Health Maintenance counseling: 1. Anticipatory guidance: Patient counseled regarding regular dental exams -q6 months advised- has not been in a while, eye exams - reading glasses only- could get check up,  avoiding smoking and second hand smoke , limiting alcohol to 2 beverages per day- rare/social .   2. Risk factor reduction:  Advised patient of need for regular exercise and diet rich and fruits and vegetables to reduce risk of heart attack and stroke. Exercise- very active with work and walks 4-5 miles a day. Diet- reasonably healthy diet.  Wt Readings from Last 3 Encounters:  06/03/19 150 lb 12.8 oz (68.4 kg)  11/16/18 156 lb 8 oz (71 kg)  06/30/18 158 lb (71.7 kg)  3. Immunizations/screenings/ancillary studies- declines flu shot- has immune response when gets shot and would like to not get this in the future Immunization History  Administered Date(s) Administered  . Pneumococcal Polysaccharide-23 07/10/2001  . Td 10/10/2002  . Tdap 07/08/2013  4. Prostate cancer screening- no family history, start at age 43  5. Colon cancer screening - mom with colon cancer in 22s- would advise GI consult age 76 6. Skin cancer screening/prevention- no dermatologist. advised regular sunscreen use. Denies worrisome, changing, or new skin lesions.  7. Testicular cancer screening-  advised monthly self exams - no issues 8. STD screening- patient opts in 9. Never smoker-   Status of chronic or acute concerns   Transposition of great arteries at birth. Had Senning procedure. In late 20s required pacemaker for sick sinus syndrome. shortness of breath with activity since he was young but still able to be very active and cardiology has not placed any restrictions.  Dyspnea since young. [facility] Dr. Corine Shelter.   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 Felt like had high libido just was unable to get erection.  - will check testosterone levels - before testosterone treatment or ED treatment- would want cardiology input  Recommended follow up:  1 year physical or sooner if needed  Lab/Order associations: will come back  fasting   ICD-10-CM   1. Preventative health care  Z00.00 CBC    Comprehensive metabolic panel    Lipid panel    Testos,Total,Free and SHBG (Male)    RPR    HIV  Antibody (routine testing w rflx)    Urine cytology ancillary only  2. Sick sinus syndrome (HCC)  I49.5 CBC    Comprehensive metabolic panel  3. Gilbert syndrome  E80.4   4. Screening for hyperlipidemia  Z13.220 Lipid panel  5. TGA (transposition of great arteries)  Q20.3   6. Erectile dysfunction, unspecified erectile dysfunction type  N52.9 Testos,Total,Free and SHBG (Male)  7. Screening examination for venereal disease  Z11.3 RPR  8. Screening for HIV (human immunodeficiency virus)  Z11.4 HIV Antibody (routine testing w rflx)  9. Screening for gonorrhea  Z11.3 Urine cytology ancillary only  10. Screening for chlamydial disease  Z11.8 Urine cytology ancillary only    No orders of the defined types were placed in this encounter.   Return precautions advised.   Garret Reddish, MD

## 2019-06-08 ENCOUNTER — Other Ambulatory Visit: Payer: Self-pay

## 2019-06-08 ENCOUNTER — Other Ambulatory Visit (INDEPENDENT_AMBULATORY_CARE_PROVIDER_SITE_OTHER): Payer: BC Managed Care – PPO

## 2019-06-08 ENCOUNTER — Other Ambulatory Visit (HOSPITAL_COMMUNITY)
Admission: RE | Admit: 2019-06-08 | Discharge: 2019-06-08 | Disposition: A | Discharge status: Home / Self Care | Payer: BC Managed Care – PPO | Source: Ambulatory Visit | Attending: Family Medicine | Admitting: Family Medicine

## 2019-06-08 DIAGNOSIS — Z118 Encounter for screening for other infectious and parasitic diseases: Secondary | ICD-10-CM | POA: Diagnosis not present

## 2019-06-08 DIAGNOSIS — Z0000 Encounter for general adult medical examination without abnormal findings: Secondary | ICD-10-CM

## 2019-06-08 DIAGNOSIS — Z113 Encounter for screening for infections with a predominantly sexual mode of transmission: Secondary | ICD-10-CM

## 2019-06-08 DIAGNOSIS — Z114 Encounter for screening for human immunodeficiency virus [HIV]: Secondary | ICD-10-CM | POA: Diagnosis not present

## 2019-06-08 DIAGNOSIS — I495 Sick sinus syndrome: Secondary | ICD-10-CM

## 2019-06-08 DIAGNOSIS — Z13220 Encounter for screening for lipoid disorders: Secondary | ICD-10-CM | POA: Diagnosis not present

## 2019-06-08 DIAGNOSIS — N529 Male erectile dysfunction, unspecified: Secondary | ICD-10-CM | POA: Diagnosis not present

## 2019-06-08 LAB — COMPREHENSIVE METABOLIC PANEL
ALT: 24 U/L (ref 0–53)
AST: 18 U/L (ref 0–37)
Albumin: 4.7 g/dL (ref 3.5–5.2)
Alkaline Phosphatase: 57 U/L (ref 39–117)
BUN: 12 mg/dL (ref 6–23)
CO2: 28 mEq/L (ref 19–32)
Calcium: 9.6 mg/dL (ref 8.4–10.5)
Chloride: 104 mEq/L (ref 96–112)
Creatinine, Ser: 0.93 mg/dL (ref 0.40–1.50)
GFR: 93.56 mL/min (ref >60.00)
Glucose, Bld: 62 mg/dL — ABNORMAL LOW (ref 70–99)
Potassium: 4 mEq/L (ref 3.5–5.1)
Sodium: 140 mEq/L (ref 135–145)
Total Bilirubin: 1.9 mg/dL — ABNORMAL HIGH (ref 0.2–1.2)
Total Protein: 7 g/dL (ref 6.0–8.3)

## 2019-06-08 LAB — LIPID PANEL
Cholesterol: 119 mg/dL (ref 0–200)
HDL: 37.4 mg/dL — ABNORMAL LOW (ref >39.00)
LDL Cholesterol: 63 mg/dL (ref 0–99)
NonHDL: 81.47
Total CHOL/HDL Ratio: 3
Triglycerides: 93 mg/dL (ref 0.0–149.0)
VLDL: 18.6 mg/dL (ref 0.0–40.0)

## 2019-06-08 LAB — CBC
HCT: 44.5 % (ref 39.0–52.0)
Hemoglobin: 15.2 g/dL (ref 13.0–17.0)
MCHC: 34.2 g/dL (ref 30.0–36.0)
MCV: 88.4 fl (ref 78.0–100.0)
Platelets: 132 10*3/uL — ABNORMAL LOW (ref 150.0–400.0)
RBC: 5.03 Mil/uL (ref 4.22–5.81)
RDW: 13 % (ref 11.5–15.5)
WBC: 5.6 10*3/uL (ref 4.0–10.5)

## 2019-06-09 LAB — URINE CYTOLOGY ANCILLARY ONLY
Chlamydia: NEGATIVE
Molecular Disclaimer: NEGATIVE
Molecular Disclaimer: NEGATIVE
Molecular Disclaimer: NORMAL
Neisseria Gonorrhea: NEGATIVE
Trichomonas: NEGATIVE

## 2019-06-14 ENCOUNTER — Telehealth: Payer: Self-pay | Admitting: Family Medicine

## 2019-06-14 NOTE — Telephone Encounter (Signed)
See note  Copied from [place] [id number]. Topic: General - Other >> Jun 14, 2019 11:34 AM Antonieta Iba C wrote: Reason for CRM: pt called in for his lab results.

## 2019-06-14 NOTE — Telephone Encounter (Signed)
Pt called and notified of labs.

## 2019-06-15 NOTE — Telephone Encounter (Signed)
Returned pt call and asked the patient what questions did he have concerning his labs. He stated 'he just wants to only talk to Dr. Yong Channel" I advised pt that Dr. Yong Channel was still seeing patients and is not available to speak with him over the phone at this time. I asked the patient could I take note of the question/questions so that I could get a message to Dr. Yong Channel and he stated " the only message that's needed is to get the doctor to call me" and the call ended.

## 2019-06-15 NOTE — Telephone Encounter (Signed)
Patient requesting call back from Dr. Yong Channel or Bevelyn Ngo regarding labs. He states he has additional questions and would like to speak with someone as soon as possible.

## 2019-06-15 NOTE — Telephone Encounter (Signed)
See note

## 2019-06-16 NOTE — Telephone Encounter (Signed)
Called Candace back and relayed below info. She will follow up tomorrow once speaking with Dr. Raliegh Ip.

## 2019-06-16 NOTE — Telephone Encounter (Signed)
He called back and stated he wanted to know if Dr Yong Channel had talk to his Cardo Dr?  He needs to know what he needs to do from here.  Is would like a call back today.  Please advise  Best number [phone]

## 2019-06-16 NOTE — Telephone Encounter (Signed)
Called and spoke with Candace at [facility] and she would like to know what are you wanting to Rx for the patient so she can run it by Dr. Corine Shelter and get back with [place] on his response. She states the doctor will be back in the office tomorrow and I told her I would call her and follow up with her tomorrow.

## 2019-06-16 NOTE — Telephone Encounter (Signed)
Can you look into this?

## 2019-06-16 NOTE — Telephone Encounter (Signed)
Sildenafil either in 50 or 100mg  dose or 20mg  dose to take 1-5 tablets per day.

## 2019-06-16 NOTE — Telephone Encounter (Signed)
[facility] Dr. Corine Shelter.  Patient would like to try erectile dysfunction medications- we need clearance from cardiology before prescribing- please reach out and see if his physician is okay with me prescribing medication or if he needs an office visit with them.  Testosterone levels were normal

## 2019-06-17 MED ORDER — SILDENAFIL CITRATE 20 MG PO TABS: ORAL_TABLET | ORAL | 3 refills | Status: DC

## 2019-06-17 NOTE — Addendum Note (Signed)
Addended by: Marin Olp on: 06/17/2019 12:27 PM   Modules accepted: Orders

## 2019-06-17 NOTE — Telephone Encounter (Signed)
Please call patient and tell him we were able to get through to his pediatric cardiologist.  They recommended starting on a very low-dose.  I would recommend he only take 1 pill maximum daily as needed.  If he does not find 1 pill effective can use 2 pills in the future.  Please call us if 2 pills is not effective either.  If he does not feel comfortable leaving messages about this-will need to be scheduled for follow-up office visit to discuss next steps

## 2019-06-17 NOTE — Telephone Encounter (Signed)
Called pt and notified him of below and that his Rx is ready at the pharmacy. Pt voiced understanding.

## 2019-06-17 NOTE — Telephone Encounter (Signed)
Candace returned the call and states she spoke with Dr. Corine Shelter and he stated Sildenafil is fine, start off with 20mg  and do not exceed 100mg .

## 2019-07-09 DIAGNOSIS — Q203 Discordant ventriculoarterial connection: Secondary | ICD-10-CM | POA: Diagnosis not present

## 2019-07-09 DIAGNOSIS — I495 Sick sinus syndrome: Secondary | ICD-10-CM | POA: Diagnosis not present

## 2019-07-09 LAB — RPR: RPR Ser Ql: NONREACTIVE

## 2019-07-09 LAB — TESTOS,TOTAL,FREE AND SHBG (FEMALE)
Free Testosterone: 56.7 pg/mL (ref 35.0–155.0)
Sex Hormone Binding: 36 nmol/L (ref 10–50)
Testosterone, Total, LC-MS-MS: 489 ng/dL (ref 250–1100)

## 2019-07-09 LAB — HIV ANTIBODY (ROUTINE TESTING W REFLEX): HIV 1&2 Ab, 4th Generation: NONREACTIVE

## 2019-11-01 DIAGNOSIS — S46002A Unspecified injury of muscle(s) and tendon(s) of the rotator cuff of left shoulder, initial encounter: Secondary | ICD-10-CM | POA: Diagnosis not present

## 2019-11-01 DIAGNOSIS — M25512 Pain in left shoulder: Secondary | ICD-10-CM | POA: Diagnosis not present

## 2019-11-01 DIAGNOSIS — M5412 Radiculopathy, cervical region: Secondary | ICD-10-CM | POA: Diagnosis not present

## 2019-11-03 ENCOUNTER — Other Ambulatory Visit: Payer: Self-pay | Admitting: Specialist

## 2019-11-03 DIAGNOSIS — M25512 Pain in left shoulder: Secondary | ICD-10-CM

## 2019-11-04 DIAGNOSIS — M5412 Radiculopathy, cervical region: Secondary | ICD-10-CM | POA: Diagnosis not present

## 2019-11-04 DIAGNOSIS — M542 Cervicalgia: Secondary | ICD-10-CM | POA: Diagnosis not present

## 2019-11-09 ENCOUNTER — Other Ambulatory Visit: Payer: Self-pay | Admitting: Chiropractic Medicine

## 2019-11-09 DIAGNOSIS — M542 Cervicalgia: Secondary | ICD-10-CM

## 2019-11-16 ENCOUNTER — Other Ambulatory Visit: Payer: Self-pay

## 2019-11-16 ENCOUNTER — Ambulatory Visit
Admission: RE | Admit: 2019-11-16 | Discharge: 2019-11-16 | Disposition: A | Discharge status: Home / Self Care | Payer: BC Managed Care – PPO | Source: Ambulatory Visit | Attending: Specialist | Admitting: Specialist

## 2019-11-16 ENCOUNTER — Ambulatory Visit
Admission: RE | Admit: 2019-11-16 | Discharge: 2019-11-16 | Disposition: A | Discharge status: Home / Self Care | Payer: BC Managed Care – PPO | Source: Ambulatory Visit | Attending: Chiropractic Medicine | Admitting: Chiropractic Medicine

## 2019-11-16 DIAGNOSIS — M25512 Pain in left shoulder: Secondary | ICD-10-CM

## 2019-11-16 DIAGNOSIS — M542 Cervicalgia: Secondary | ICD-10-CM

## 2019-11-16 MED ORDER — IOPAMIDOL (ISOVUE-M 200) INJECTION 41%
12.0000 mL | Freq: Once | INTRAMUSCULAR | Status: AC
  Administered 2019-11-16: 15:00:00 12 mL via INTRA_ARTICULAR

## 2019-11-24 DIAGNOSIS — S43432D Superior glenoid labrum lesion of left shoulder, subsequent encounter: Secondary | ICD-10-CM | POA: Diagnosis not present

## 2019-12-08 DIAGNOSIS — M25512 Pain in left shoulder: Secondary | ICD-10-CM | POA: Diagnosis not present

## 2019-12-14 DIAGNOSIS — M25512 Pain in left shoulder: Secondary | ICD-10-CM | POA: Diagnosis not present

## 2020-02-17 DIAGNOSIS — Q243 Pulmonary infundibular stenosis: Secondary | ICD-10-CM | POA: Diagnosis not present

## 2020-02-17 DIAGNOSIS — Q203 Discordant ventriculoarterial connection: Secondary | ICD-10-CM | POA: Diagnosis not present

## 2020-02-17 DIAGNOSIS — Z9889 Other specified postprocedural states: Secondary | ICD-10-CM | POA: Diagnosis not present

## 2020-02-17 DIAGNOSIS — I495 Sick sinus syndrome: Secondary | ICD-10-CM | POA: Diagnosis not present

## 2020-03-14 NOTE — Progress Notes (Signed)
Pt. Needs orders for the upcomming surgery.PST appointment on 03/15/20.Thank you,

## 2020-03-14 NOTE — Patient Instructions (Signed)
DUE TO COVID-19 ONLY ONE VISITOR IS ALLOWED TO COME WITH YOU AND STAY IN THE WAITING ROOM ONLY DURING PRE OP AND PROCEDURE DAY OF SURGERY. THE 1 VISITOR MAY VISIT WITH YOU AFTER SURGERY IN YOUR PRIVATE ROOM DURING VISITING HOURS ONLY!  YOU NEED TO HAVE A COVID 19 TEST ON: 03/21/20 @        , THIS TEST MUST BE DONE BEFORE SURGERY, COME  [place], [name] , [phone].  ([place]) ONCE YOUR COVID TEST IS COMPLETED, PLEASE BEGIN THE QUARANTINE INSTRUCTIONS AS OUTLINED IN YOUR HANDOUT.                John Myers   Your procedure is scheduled on:    Report to [facility] Main  Entrance   Report to admitting at: 8:30 am AM     Call this number if you have problems the morning of surgery [phone]    Remember: Do not eat solid food  :After Midnight. Clear liquids from midnight until 7:30 am     CLEAR LIQUID DIET   Foods Allowed                                                                     Foods Excluded  Coffee and tea, regular and decaf                             liquids that you cannot  Plain Jell-O any favor except red or purple                                           see through such as: Fruit ices (not with fruit pulp)                                     milk, soups, orange juice  Iced Popsicles                                    All solid food Carbonated beverages, regular and diet                                    Cranberry, grape and apple juices Sports drinks like Gatorade Lightly seasoned clear broth or consume(fat free) Sugar, honey syrup  Sample Menu Breakfast                                Lunch                                     Supper Cranberry juice                    Beef broth  Chicken broth Jell-O                                     Grape juice                           Apple juice Coffee or tea                        Jell-O                                      Popsicle                                                 Coffee or tea                        Coffee or tea  _____________________________________________________________________   BRUSH YOUR TEETH MORNING OF SURGERY AND RINSE YOUR MOUTH OUT, NO CHEWING GUM CANDY OR MINTS.                                     You may not have any metal on your body including hair pins and              piercings  Do not wear jewelry, lotions, powders or perfumes, deodorant             Men may shave face and neck.   Do not bring valuables to the hospital. [name].  Contacts, dentures or bridgework may not be worn into surgery.  Leave suitcase in the car. After surgery it may be brought to your room.     Patients discharged the day of surgery will not be allowed to drive home. IF YOU ARE HAVING SURGERY AND GOING HOME THE SAME DAY, YOU MUST HAVE AN ADULT TO DRIVE YOU HOME AND BE WITH YOU FOR 24 HOURS. YOU MAY GO HOME BY TAXI OR UBER OR ORTHERWISE, BUT AN ADULT MUST ACCOMPANY YOU HOME AND STAY WITH YOU FOR 24 HOURS.  Name and phone number of your driver:  Special Instructions: N/A              Please read over the following fact sheets you were given: _____________________________________________________________________  [facility] - Preparing for Surgery Before surgery, you can play an important role.  Because skin is not sterile, your skin needs to be as free of germs as possible.  You can reduce the number of germs on your skin by washing with CHG (chlorahexidine gluconate) soap before surgery.  CHG is an antiseptic cleaner which kills germs and bonds with the skin to continue killing germs even after washing. Please DO NOT use if you have an allergy to CHG or antibacterial soaps.  If your skin becomes reddened/irritated stop using the CHG and inform your nurse when you arrive at Short Stay. Do not shave (including legs and underarms) for at least  48 hours prior to the first CHG  shower.  You may shave your face/neck. Please follow these instructions carefully:  1.  Shower with CHG Soap the night before surgery and the  morning of Surgery.  2.  If you choose to wash your hair, wash your hair first as usual with your  normal  shampoo.  3.  After you shampoo, rinse your hair and body thoroughly to remove the  shampoo.                           4.  Use CHG as you would any other liquid soap.  You can apply chg directly  to the skin and wash                       Gently with a scrungie or clean washcloth.  5.  Apply the CHG Soap to your body ONLY FROM THE NECK DOWN.   Do not use on face/ open                           Wound or open sores. Avoid contact with eyes, ears mouth and genitals (private parts).                       Wash face,  Genitals (private parts) with your normal soap.             6.  Wash thoroughly, paying special attention to the area where your surgery  will be performed.  7.  Thoroughly rinse your body with warm water from the neck down.  8.  DO NOT shower/wash with your normal soap after using and rinsing off  the CHG Soap.                9.  Pat yourself dry with a clean towel.            10.  Wear clean pajamas.            11.  Place clean sheets on your bed the night of your first shower and do not  sleep with pets. Day of Surgery : Do not apply any lotions/deodorants the morning of surgery.  Please wear clean clothes to the hospital/surgery center.  FAILURE TO FOLLOW THESE INSTRUCTIONS MAY RESULT IN THE CANCELLATION OF YOUR SURGERY PATIENT SIGNATURE_________________________________  NURSE SIGNATURE__________________________________  ________________________________________________________________________

## 2020-03-15 ENCOUNTER — Other Ambulatory Visit: Payer: Self-pay

## 2020-03-15 ENCOUNTER — Encounter (HOSPITAL_COMMUNITY): Payer: Self-pay

## 2020-03-15 ENCOUNTER — Encounter (HOSPITAL_COMMUNITY)
Admission: RE | Admit: 2020-03-15 | Discharge: 2020-03-15 | Disposition: A | Discharge status: Home / Self Care | Payer: BC Managed Care – PPO | Source: Ambulatory Visit | Attending: Specialist | Admitting: Specialist

## 2020-03-15 DIAGNOSIS — Z01818 Encounter for other preprocedural examination: Secondary | ICD-10-CM | POA: Diagnosis not present

## 2020-03-15 HISTORY — DX: Cardiac arrhythmia, unspecified: I49.9

## 2020-03-15 HISTORY — DX: Angina pectoris, unspecified: I20.9

## 2020-03-15 HISTORY — DX: Malignant (primary) neoplasm, unspecified: C80.1

## 2020-03-15 NOTE — Progress Notes (Signed)
COVID Vaccine Completed: Date COVID Vaccine completed: COVID vaccine manufacturer: [place]   PCP - Dr. Garret Reddish Cardiologist - Jeralyn Bennett. LOV: 02/17/20 Epic: clearance 02/17/20. :Chart  Chest x-ray -  EKG - 07/09/19 Stress Test -  ECHO - 02/17/20 Cardiac Cath -   Sleep Study -  CPAP -   Fasting Blood Sugar -  Checks Blood Sugar _____ times a day  Blood Thinner Instructions: Aspirin Instructions: Last Dose:  Anesthesia review:   Patient denies shortness of breath, fever, cough and chest pain at PAT appointment   Patient verbalized understanding of instructions that were given to them at the PAT appointment. Patient was also instructed that they will need to review over the PAT instructions again at home before surgery.

## 2020-03-15 NOTE — Progress Notes (Signed)
Pacemaker orders request was faxed to Dr. Jeraldine Loots to [phone] today.

## 2020-03-15 NOTE — Progress Notes (Signed)
Pt's PST interview was done over the phone.Lab appointment has been rescheduled for 2:00 pm on 03/17/20.Pt. refused to schedule his COVID test appointment for 7/13,he clams that he needs to work that day.RN will notified Dr. Harrietta Guardian office about it.

## 2020-03-15 NOTE — Progress Notes (Signed)
Pt. Was called several times after 2:15 pm today.Message was left on the machine to reminded him about PST appointment.Pt. called back at 2:30 pm saying he forgot about the appointment today and that he's an hour away.RN told patient that he will be reschedule for a different day by the secretary.

## 2020-03-17 ENCOUNTER — Inpatient Hospital Stay (HOSPITAL_COMMUNITY): Admission: RE | Admit: 2020-03-17 | Payer: BC Managed Care – PPO | Source: Ambulatory Visit

## 2020-03-17 ENCOUNTER — Encounter (HOSPITAL_COMMUNITY): Payer: Self-pay | Admitting: Physician Assistant

## 2020-03-17 NOTE — Progress Notes (Signed)
Pt. Called back to said that he is waiting to hear from Dr's Theda Sers office,because he can not afford to pay the copayment for the surgery.

## 2020-03-17 NOTE — Progress Notes (Signed)
Pt. Did not show up for his lab appointment today.Several attempts to reach pt. By phone were unsuccessful.

## 2020-03-17 NOTE — Progress Notes (Signed)
Anesthesia Chart Review   Case: [id number] Date/Time: 03/24/20 1009   Procedure: SHOULDER ARTHROSCOPY WITH LABRAL REPAIR debridement possible biceps tenodesis (Left Shoulder) - expanded interscalene block   Anesthesia type: General   Pre-op diagnosis: Left shoulder labral tear   [facility]   Surgeons: Sydnee Cabal, MD       DISCUSSION:33 y.o. never smoker with h/o TGA s/p senning procedure at 56 months of age, PVC, sick sinus syndrome s/p PPM 2014, left shoulder labral tear scheduled for above procedure 03/24/2020 with Dr. Sydnee Cabal.   Pt seen by cardiology 02/17/2020 for preoperative evaluation.  Per OV note, "Mubarak Bevens is a very pleasant 34 y.o. male with D-TGA status post atrial switch (Senning operation) as well as sick sinus syndrome status post single lead atrial pacemaker placement, presenting today to the [facility] for follow-up. He has excellent functional status, no clinical history of heart failure, and a very reassuring echo with essentially normal systemic RV function and only mild subpulmonary obstruction, which is stable. While he will be at mildly increased risk of atrial arrhythmias and heart failure after shoulder surgery, I anticipate that he will tolerate surgery well. He reports that the surgery is planned at [facility], where cardiology and cardiac anesthesia support should be available if necessary."  Device orders received which state procedure will likely interfere with device function, recommends asynchronous programming to 70 bpm then original settings after procedure.  [employer] device rep informed of procedure date and time.    Anticipate pt can proceed with planned procedure barring acute status change.   VS: Ht 6' (1.829 m)   Wt 74.8 kg   BMI 22.38 kg/m   PROVIDERS: Marin Olp, MD is PCP   Jeralyn Bennett, MD is Cardiologist with [facility] in  [place]:  pending (all labs ordered are listed, but only abnormal results are displayed)  Labs Reviewed - No data to display   IMAGES:   EKG: 07/09/2019 Normal sinus rhythm  Right bundle branch block  Abnormal ECG    CV: Echo 02/17/2020 INTERPRETATION SUMMARY    D-transposition of the great arteries status-post Senning operation     Mildly dilated systemic (morphologic right) ventricle with low normal systolic function.     Small subpulmonic (morphologic left) ventricle with hyperdynamic systolic function.    Mild systemic AV (morphologic tricuspid) valve regurgitation.    Mild venous AV (morphologic mitral) valve regurgitation.    Mild flow acceleration across the subpulmonary outflow tract with associated systolic    anterior motion of the mitral valve (peak gradient 27 mmHg).    Mild pulmonary valve regurgitation.    No obvious baffle leaks or obstruction noted.     Compared with prior study, no significant changes were seen.   Past Medical History:  Diagnosis Date   Allergic rhinitis    Anginal pain ([place])    Cancer ([place])    Depression    never treated with meds    Dyspnea    Dysrhythmia    PVC'S   Finger injury    Gilbert syndrome 2005   Premature ventricular contractions    Presence of permanent cardiac pacemaker    Right ventricular dysfunction    Sick sinus syndrome ([place]) 2014   s/p PPM   TGA (transposition of great arteries)    surgical correction at [facility] at 73 months of age    Past Surgical History:  Procedure Laterality  Date   CLOSED REDUCTION METACARPAL WITH PERCUTANEOUS PINNING Right 07/03/2016   Procedure: RIGHT HAND CLOSED REDUCTION AND PINNING;  Surgeon: Iran Planas, MD;  Location: [place];  Service: Orthopedics;  Laterality: Right;  AXILLARY BLOCK WITH SEDATION   PACEMAKER INSERTION  03/08/13   MDT Adapta SR (atrial) PPM implanted by Dr Geanie Kenning at [facility] for sick sinus syndrome   SHOULDER ARTHROSCOPY WITH CAPSULORRHAPHY Left 03/26/2016    Procedure: LEFT SHOULDER ATHROSCOPY WITH DEBRIDEMENT, chromoplasty, and bursectomy.;  Surgeon: Sydnee Cabal, MD;  [facility];  Service: Orthopedics;  Laterality: Left;  ANESTHESIA: GENERAL/INTERSCALENE BLOCK   SUBACROMIAL DECOMPRESSION Left 03/26/2016   Procedure: LEFT SUBACROMIAL DECOMPRESSION;  Surgeon: Sydnee Cabal, MD;  [facility];  Service: Orthopedics;  Laterality: Left;   TRANSPOSITION OF GREAT VESSELS REPAIR     Senning procedure performed at 75 months of age at [facility].   WISDOM TOOTH EXTRACTION      MEDICATIONS:  diphenhydrAMINE (BENADRYL) 25 mg capsule   sildenafil (REVATIO) 20 MG tablet   No current facility-administered medications for this encounter.      Konrad Felix, PA-C [facility] [phone]

## 2020-03-24 ENCOUNTER — Ambulatory Visit (HOSPITAL_COMMUNITY): Admission: RE | Admit: 2020-03-24 | Payer: BC Managed Care – PPO | Source: Home / Self Care | Admitting: Specialist

## 2020-03-24 ENCOUNTER — Encounter (HOSPITAL_COMMUNITY): Admission: RE | Payer: Self-pay | Source: Home / Self Care

## 2020-03-24 SURGERY — ARTHROSCOPY, SHOULDER, WITH GLENOID LABRUM REPAIR
Anesthesia: General | Site: Shoulder | Laterality: Left

## 2020-05-08 ENCOUNTER — Encounter: Payer: Self-pay | Admitting: Family Medicine

## 2020-05-08 ENCOUNTER — Other Ambulatory Visit: Payer: Self-pay

## 2020-05-08 ENCOUNTER — Ambulatory Visit (INDEPENDENT_AMBULATORY_CARE_PROVIDER_SITE_OTHER): Payer: BC Managed Care – PPO | Admitting: Family Medicine

## 2020-05-08 VITALS — BP 118/78 | HR 74 | Temp 99.5°F | Resp 18 | Ht 72.0 in | Wt 165.8 lb

## 2020-05-08 DIAGNOSIS — S43432D Superior glenoid labrum lesion of left shoulder, subsequent encounter: Secondary | ICD-10-CM

## 2020-05-08 NOTE — Patient Instructions (Signed)
Please return for a complete physical with your PCP, Dr. Yong Channel.   Please contact [facility] for a note documenting Dr. Theda Sers left shoulder diagnoses and limitations for your insurance company.   If you have any questions or concerns, please don't hesitate to send me a message via MyChart or call the office at [phone]. Thank you for visiting with [place] today! It's our pleasure caring for you.

## 2020-05-08 NOTE — Progress Notes (Signed)
Subjective  CC:  Chief Complaint  Patient presents with  . Left Shoulder Pain    Needs a up to date note for disability for his shoulder.    Same day acute visit; PCP not available. New pt to me. Chart reviewed.   HPI: John Myers is a 34 y.o. male who presents to the office today to address the problems listed above in the chief complaint.  I reviewed his chart.  Recently seen by Dr. Theda Sers and orthopedics.  Imaging of the left shoulder shows a small SLAP tear of the labrum.  He was going to have surgery but could not afford it.  Today he is requesting a note documenting his shoulder problems for an insurance request.  He is trying to get disability for his shoulder problems.  He reports these problems started back in 2014 after a surgery.  He complains of limited range of motion and pain.  No new symptoms.  Assessment  1. 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  I provided a letter documenting this.  I do recommend that he follow-up with orthopedics for further documentation clarification of his limitations and prognoses.  I also recommend follow-up with his primary care doctor for complete physical.  Follow up: Return for complete physical.  Visit date not found  No orders of the defined types were placed in this encounter.  No orders of the defined types were placed in this encounter.     I reviewed the patients updated PMH, FH, and SocHx.    Patient Active Problem List   Diagnosis Date Noted  . BPPV (benign paroxysmal positional vertigo) 03/09/2015  . Sick sinus syndrome ([place]) 03/12/2014  . TGA (transposition of great arteries)   . Allergic rhinitis 01/22/2013  . Rosanna Randy syndrome 2005   Current Meds  Medication Sig  . diphenhydrAMINE (BENADRYL) 25 mg capsule Take 25 mg by mouth every 6 (six) hours as needed for allergies.    Allergies: Patient has No Known  Allergies. Family History: Patient family history includes Breast cancer in his maternal grandmother; Colon cancer in his mother; Healthy in his brother, father, and sister; Heart attack in his paternal grandfather; Hyperlipidemia in his paternal grandfather; Hypertension in his paternal grandfather. Social History:  Patient  reports that he has never smoked. He has never used smokeless tobacco. He reports that he does not drink alcohol and does not use drugs.  Review of Systems: Constitutional: Negative for fever malaise or anorexia Cardiovascular: negative for chest pain Respiratory: negative for SOB or persistent cough Gastrointestinal: negative for abdominal pain  Objective  Vitals: BP 118/78   Pulse 74   Temp 99.5 F (37.5 C) (Temporal)   Resp 18   Ht 6' (1.829 m)   Wt 165 lb 12.8 oz (75.2 kg)   SpO2 96%   BMI 22.49 kg/m  General: no acute distress , A&Ox3 Left shoulder: Mildly limited abduction due to pain.       Commons side effects, risks, benefits, and alternatives for medications and treatment plan prescribed today were discussed, and the patient expressed understanding of the given instructions. Patient is instructed to call or message via MyChart if he/she has any questions or concerns regarding our treatment plan. No barriers to understanding were identified. We discussed Red Flag symptoms and signs in detail. Patient expressed understanding regarding what to do in case of urgent or  emergency type symptoms.   Medication list was reconciled, printed and provided to the patient in AVS. Patient instructions and summary information was reviewed with the patient as documented in the AVS. This note was prepared with assistance of Dragon voice recognition software. Occasional wrong-word or sound-a-like substitutions may have occurred due to the inherent limitations of voice recognition software  This visit occurred during the SARS-CoV-2 public health emergency.  Safety  protocols were in place, including screening questions prior to the visit, additional usage of staff PPE, and extensive cleaning of exam room while observing appropriate contact time as indicated for disinfecting solutions.

## 2020-05-16 ENCOUNTER — Telehealth: Payer: Self-pay | Admitting: Family Medicine

## 2020-05-16 NOTE — Telephone Encounter (Signed)
Patient's father is calling in this afternoon following up to see if Dr.Andy has received the fax that was sent to the office last week,should be from [facility].

## 2020-05-19 NOTE — Telephone Encounter (Signed)
LMOVM advising we have no received any paperwork. Left our office number and fax number

## 2020-05-31 ENCOUNTER — Other Ambulatory Visit: Payer: Self-pay

## 2020-05-31 ENCOUNTER — Telehealth: Payer: Self-pay

## 2020-05-31 DIAGNOSIS — I495 Sick sinus syndrome: Secondary | ICD-10-CM

## 2020-05-31 NOTE — Telephone Encounter (Signed)
Called and lm on pt vm tcb. 

## 2020-05-31 NOTE — Telephone Encounter (Signed)
Patient called in and stated he did want the referral and he wanted it sent to [facility] cardiology in [facility].

## 2020-05-31 NOTE — Telephone Encounter (Signed)
Referral placed for [facility].

## 2020-05-31 NOTE — Telephone Encounter (Signed)
Confirm with patient that he wants this or that that dad on DPR.  I am fine with referral to cardiology under sick sinus syndrome-make sure we have the cardiology office that patient wants

## 2020-05-31 NOTE — Telephone Encounter (Signed)
Pt.'s father , Banjamin Stovall, called in asking if we can put in a referral for a cardiologist for his son. condition and a pacemaker and they are struggling getting him into a cardiology office without a referral. He is asking for the help of Dr. Yong Channel. He asks that someone call him back and if we could expedite it.   * also sent a similar message in the Father, Abie Cheek Chart.*

## 2020-06-01 DIAGNOSIS — M25512 Pain in left shoulder: Secondary | ICD-10-CM | POA: Diagnosis not present

## 2020-06-07 DIAGNOSIS — Q203 Discordant ventriculoarterial connection: Secondary | ICD-10-CM | POA: Diagnosis not present

## 2020-06-07 DIAGNOSIS — I495 Sick sinus syndrome: Secondary | ICD-10-CM | POA: Diagnosis not present

## 2020-06-07 DIAGNOSIS — I517 Cardiomegaly: Secondary | ICD-10-CM | POA: Diagnosis not present

## 2020-06-07 DIAGNOSIS — I4892 Unspecified atrial flutter: Secondary | ICD-10-CM | POA: Diagnosis not present

## 2020-06-07 DIAGNOSIS — R002 Palpitations: Secondary | ICD-10-CM | POA: Diagnosis not present

## 2020-06-07 DIAGNOSIS — I480 Paroxysmal atrial fibrillation: Secondary | ICD-10-CM | POA: Diagnosis not present

## 2020-06-07 DIAGNOSIS — Z8774 Personal history of (corrected) congenital malformations of heart and circulatory system: Secondary | ICD-10-CM | POA: Diagnosis not present

## 2020-06-07 DIAGNOSIS — Z950 Presence of cardiac pacemaker: Secondary | ICD-10-CM | POA: Diagnosis not present

## 2020-06-09 ENCOUNTER — Telehealth: Payer: Self-pay

## 2020-06-09 NOTE — Telephone Encounter (Signed)
[facility] for refill or OV needed?

## 2020-06-09 NOTE — Telephone Encounter (Signed)
Pt called stating he was prescribed albuterol by one of his previous doctors who is now retired. Pt states he is needing a new inhaler with the albuterol. Please advise.

## 2020-06-10 NOTE — Telephone Encounter (Signed)
I do not have asthma on his list-ideally would be a visit-same day okay and even virtual okay-want to get more information on what is going on

## 2020-06-11 DIAGNOSIS — I517 Cardiomegaly: Secondary | ICD-10-CM | POA: Diagnosis not present

## 2020-06-12 NOTE — Telephone Encounter (Signed)
Scheduled pt for Wednesday 06/14/2020

## 2020-06-12 NOTE — Telephone Encounter (Signed)
Please call and schedule pt OV for asthma per Dr. Yong Channel, SDA is fine.

## 2020-06-13 DIAGNOSIS — Q203 Discordant ventriculoarterial connection: Secondary | ICD-10-CM | POA: Diagnosis not present

## 2020-06-13 DIAGNOSIS — Z23 Encounter for immunization: Secondary | ICD-10-CM | POA: Diagnosis not present

## 2020-06-13 DIAGNOSIS — I495 Sick sinus syndrome: Secondary | ICD-10-CM | POA: Diagnosis not present

## 2020-06-13 DIAGNOSIS — Z8774 Personal history of (corrected) congenital malformations of heart and circulatory system: Secondary | ICD-10-CM | POA: Diagnosis not present

## 2020-06-13 NOTE — Patient Instructions (Signed)
Health Maintenance Due  Topic Date Due  . Hepatitis C Screening  Never done  . COVID-19 Vaccine (1) Has not received his vaccine yet.  Never done  . INFLUENZA VACCINE  Never done   Depression screen [facility] 2/9 06/03/2019  Decreased Interest 0  Down, Depressed, Hopeless 0  PHQ - 2 Score 0  Altered sleeping 3  Tired, decreased energy 3  Change in appetite 0  Feeling bad or failure about yourself  0  Trouble concentrating 0  Moving slowly or fidgety/restless 0  Suicidal thoughts 0  PHQ-9 Score 6  Difficult doing work/chores Not difficult at all

## 2020-06-13 NOTE — Progress Notes (Signed)
Phone [phone] Virtual visit via Video note   Subjective:  Chief complaint: No chief complaint on file.  This visit type was conducted due to national recommendations for restrictions regarding the COVID-19 Pandemic (e.g. social distancing).  This format is felt to be most appropriate for this patient at this time balancing risks to patient and risks to population by having him in for in person visit.  No physical exam was performed (except for noted visual exam or audio findings with Telehealth visits).    Our team/I connected with [name] at 11:20 AM EDT by a video enabled telemedicine application (doxy.me or caregility through epic) and verified that I am speaking with the correct person using two identifiers.  Location patient: [place] Location provider: [facility], office Persons participating in the virtual visit:  patient  Our team/I discussed the limitations of evaluation and management by telemedicine and the availability of in person appointments. In light of current covid-19 pandemic, patient also understands that we are trying to protect them by minimizing in office contact if at all possible.  The patient expressed consent for telemedicine visit and agreed to proceed. Patient understands insurance will be billed.   Past Medical History-  Patient Active Problem List   Diagnosis Date Noted  . Asthma 06/14/2020    Priority: High  . Sick sinus syndrome ([place]) 03/12/2014    Priority: High  . TGA (transposition of great arteries)     Priority: High  . BPPV (benign paroxysmal positional vertigo) 03/09/2015    Priority: Low  . Allergic rhinitis 01/22/2013    Priority: Low  . Left shoulder pain 06/14/2020  . Rosanna Randy syndrome 2005    Medications- reviewed and updated Current Outpatient Medications  Medication Sig Dispense Refill  . diphenhydrAMINE (BENADRYL) 25 mg capsule Take 25 mg by mouth daily.     Marland Kitchen gabapentin (NEURONTIN) 300 MG capsule Take 1 capsule (300  mg total) by mouth at bedtime. 90 capsule 3  . albuterol (VENTOLIN HFA) 108 (90 Base) MCG/ACT inhaler Inhale 2 puffs into the lungs every 6 (six) hours as needed for wheezing or shortness of breath. 1 each 5  . budesonide-formoterol (SYMBICORT) 160-4.5 MCG/ACT inhaler Inhale 2 puffs into the lungs 2 (two) times daily. 1 each 11  . sildenafil (REVATIO) 20 MG tablet Take 1-2 tablets as needed for erectile dysfunction. (Patient not taking: Reported on 03/02/2020) 25 tablet 3   No current facility-administered medications for this visit.     Objective:  no self reported vitals Gen: NAD, active in his yard during visit Lungs: nonlabored, normal respiratory rate today Skin: appears dry, no obvious rash     Assessment and Plan   # Asthma-reports years of issues-[facility] has stopped prescribing his albuterol-we had not previously discussed diagnosis S: Maintenance Medication: None As needed medication: Albuterol. Patient is using this greater than 7 times per week.  Patient states uses every day before bed.  Notes wheezing and sensation of chest tightness which resolves with albuterol.  Also has some allergy issues.  Occasionally during the daytime will use albuterol up to 4 times a day depending on activity level.  He also wakes up 3-4 times a week with a nighttime cough if he runs out of albuterol and does not take it before bed A/P: Asthma is very poorly controlled.  We will start Symbicort 2 puffs twice daily and asked him to update me in a month.  We discussed possibly stronger controller inhaler if no significant improvement-cost has been  an issue so may have to change inhalers  We discussed the following Asthma control goals:   Full participation in all desired activities (may need albuterol before activity)  Albuterol use two time or less a week on average (not counting use with activity)  Cough interfering with sleep two time or less a month  Oral steroids no more than once a year  No  hospitalizations  #Left shoulder pain (prior surgery on his shoulder which apparently was not very successful) -patient reports left shoulder pain particularly at night if he lays on his left side-he actually wakes up from sleep.  Has been started on gabapentin 300 [facility]-he requested I refilled this medication-it works very well for him  Recommended follow up: We did not schedule plan follow-up-every 6 to 12 months would be reasonable-overdue for physical  Lab/Order associations:   ICD-10-CM   1. Severe persistent asthma, unspecified whether complicated  I71.85   2. Chronic left shoulder pain  M25.512    G89.29     Meds ordered this encounter  Medications  . budesonide-formoterol (SYMBICORT) 160-4.5 MCG/ACT inhaler    Sig: Inhale 2 puffs into the lungs 2 (two) times daily.    Dispense:  1 each    Refill:  11  . albuterol (VENTOLIN HFA) 108 (90 Base) MCG/ACT inhaler    Sig: Inhale 2 puffs into the lungs every 6 (six) hours as needed for wheezing or shortness of breath.    Dispense:  1 each    Refill:  5  . gabapentin (NEURONTIN) 300 MG capsule    Sig: Take 1 capsule (300 mg total) by mouth at bedtime.    Dispense:  90 capsule    Refill:  3   Return precautions advised.  Garret Reddish, MD

## 2020-06-14 ENCOUNTER — Other Ambulatory Visit: Payer: Self-pay

## 2020-06-14 ENCOUNTER — Telehealth (INDEPENDENT_AMBULATORY_CARE_PROVIDER_SITE_OTHER): Payer: BC Managed Care – PPO | Admitting: Family Medicine

## 2020-06-14 ENCOUNTER — Encounter: Payer: Self-pay | Admitting: Family Medicine

## 2020-06-14 DIAGNOSIS — G8929 Other chronic pain: Secondary | ICD-10-CM

## 2020-06-14 DIAGNOSIS — J4550 Severe persistent asthma, uncomplicated: Secondary | ICD-10-CM

## 2020-06-14 DIAGNOSIS — M25512 Pain in left shoulder: Secondary | ICD-10-CM | POA: Diagnosis not present

## 2020-06-14 DIAGNOSIS — J45909 Unspecified asthma, uncomplicated: Secondary | ICD-10-CM | POA: Insufficient documentation

## 2020-06-14 MED ORDER — ALBUTEROL SULFATE HFA 108 (90 BASE) MCG/ACT IN AERS
2.0000 | INHALATION_SPRAY | Freq: Four times a day (QID) | RESPIRATORY_TRACT | 5 refills | Status: DC | PRN
Start: 2020-06-14 — End: 2022-01-29

## 2020-06-14 MED ORDER — GABAPENTIN 300 MG PO CAPS: 300.0000 mg | ORAL_CAPSULE | Freq: Every day | ORAL | 3 refills | Status: DC

## 2020-06-14 MED ORDER — BUDESONIDE-FORMOTEROL FUMARATE 160-4.5 MCG/ACT IN AERO
2.0000 | INHALATION_SPRAY | Freq: Two times a day (BID) | RESPIRATORY_TRACT | 11 refills | Status: DC
Start: 2020-06-14 — End: 2022-01-29

## 2020-06-14 NOTE — Assessment & Plan Note (Addendum)
#   Asthma-reports years of issues-[facility] has stopped prescribing his albuterol-we had not previously discussed diagnosis S: Maintenance Medication: None As needed medication: Albuterol. Patient is using this greater than 7 times per week.  Patient states uses every day before bed.  Notes wheezing and sensation of chest tightness which resolves with albuterol.  Also has some allergy issues.  Occasionally during the daytime will use albuterol up to 4 times a day depending on activity level.  He also wakes up 3-4 times a week with a nighttime cough if he runs out of albuterol and does not take it before bed A/P: Asthma is very poorly controlled.  We will start Symbicort 2 puffs twice daily and asked him to update me in a month.  We discussed possibly stronger controller inhaler if no significant improvement-cost has been an issue so may have to change inhalers  We discussed the following Asthma control goals:   Full participation in all desired activities (may need albuterol before activity)  Albuterol use two time or less a week on average (not counting use with activity)  Cough interfering with sleep two time or less a month  Oral steroids no more than once a year  No hospitalizations

## 2020-06-14 NOTE — Assessment & Plan Note (Signed)
#  Left shoulder pain (prior surgery on his shoulder which apparently was not very successful) -patient reports left shoulder pain particularly at night if he lays on his left side-he actually wakes up from sleep.  Has been started on gabapentin 300 [facility]-he requested I refilled this medication-it works very well for him

## 2020-06-26 DIAGNOSIS — I495 Sick sinus syndrome: Secondary | ICD-10-CM | POA: Diagnosis not present

## 2020-07-26 DIAGNOSIS — Q203 Discordant ventriculoarterial connection: Secondary | ICD-10-CM | POA: Diagnosis not present

## 2020-07-26 DIAGNOSIS — Z950 Presence of cardiac pacemaker: Secondary | ICD-10-CM | POA: Diagnosis not present

## 2020-07-26 DIAGNOSIS — I519 Heart disease, unspecified: Secondary | ICD-10-CM | POA: Diagnosis not present

## 2020-07-26 DIAGNOSIS — I495 Sick sinus syndrome: Secondary | ICD-10-CM | POA: Diagnosis not present

## 2020-07-26 DIAGNOSIS — Q243 Pulmonary infundibular stenosis: Secondary | ICD-10-CM | POA: Diagnosis not present

## 2020-08-15 ENCOUNTER — Ambulatory Visit (INDEPENDENT_AMBULATORY_CARE_PROVIDER_SITE_OTHER): Payer: BC Managed Care – PPO

## 2020-08-15 ENCOUNTER — Other Ambulatory Visit: Payer: Self-pay

## 2020-08-15 DIAGNOSIS — Z23 Encounter for immunization: Secondary | ICD-10-CM

## 2020-08-15 NOTE — Progress Notes (Signed)
John Myers 34 y.o. male presents to office today for tetanus shot per Garret Reddish, MD. Administered TDAP 0.5 mL IM right arm. Patient tolerated well.

## 2020-08-15 NOTE — Progress Notes (Signed)
I have reviewed and agree with note, evaluation, plan.   Leather Estis, MD  

## 2020-09-12 ENCOUNTER — Other Ambulatory Visit (HOSPITAL_COMMUNITY): Payer: Self-pay | Admitting: *Deleted

## 2020-09-12 ENCOUNTER — Ambulatory Visit (HOSPITAL_COMMUNITY): Payer: BC Managed Care – PPO | Attending: Pediatric Cardiology

## 2020-09-12 ENCOUNTER — Other Ambulatory Visit: Payer: Self-pay

## 2020-09-12 DIAGNOSIS — Z9889 Other specified postprocedural states: Secondary | ICD-10-CM

## 2020-09-12 DIAGNOSIS — Q243 Pulmonary infundibular stenosis: Secondary | ICD-10-CM

## 2020-09-12 DIAGNOSIS — Q203 Discordant ventriculoarterial connection: Secondary | ICD-10-CM | POA: Insufficient documentation

## 2020-09-12 DIAGNOSIS — R0600 Dyspnea, unspecified: Secondary | ICD-10-CM | POA: Diagnosis not present

## 2020-09-12 NOTE — Progress Notes (Signed)
CPX Order

## 2020-11-16 ENCOUNTER — Telehealth: Payer: Self-pay

## 2020-11-16 NOTE — Telephone Encounter (Signed)
See below, have you done this for pt in the past? Or does pt need an OV?

## 2020-11-16 NOTE — Telephone Encounter (Signed)
Pt called and asked for Dr. Yong Channel to call him. I asked In regards to what? Pt stated his shoulder. I said Dr. Yong Channel is gone for the day ( mistakenly thinking it was his short day.) But I offered for one of his assistants to call. Pt responded with an attitude, uhh what is his assistant going to do for my shoulder? I told pt that they could help advise him with what to do. Pt stated he needed to talk to Dr. Yong Channel and get a medication prescribed. I told pt Dr. Yong Channel doesn't do phone calls to people and prescribe medication. Pt says Dr. Yong Channel has done that for him before. Pt said well have Dr. Yong Channel or his asst. Call. I told pt his asst would call today or tomorrow. Pt huffed and asked what my name was. I told him Janett Billow. Then pt hung up.

## 2020-11-16 NOTE — Telephone Encounter (Signed)
Please recommend an office visit-I need to examine him to know what would be best for his shoulder.  We have had some interactions in the past with him by phone where he gets rather frustrated but he tends to act differently in the office-I do not fully understand this

## 2020-11-17 NOTE — Telephone Encounter (Signed)
Patient is scheduled for a same day appointment Monday.

## 2020-11-17 NOTE — Telephone Encounter (Signed)
See below

## 2020-11-19 NOTE — Progress Notes (Deleted)
  Phone [phone] In person visit   Subjective:   John Myers is a 35 y.o. year old very pleasant male patient who presents for/with See problem oriented charting No chief complaint on file.   This visit occurred during the SARS-CoV-2 public health emergency.  Safety protocols were in place, including screening questions prior to the visit, additional usage of staff PPE, and extensive cleaning of exam room while observing appropriate contact time as indicated for disinfecting solutions.   Past Medical History-  Patient Active Problem List   Diagnosis Date Noted  . Asthma 06/14/2020  . Left shoulder pain 06/14/2020  . BPPV (benign paroxysmal positional vertigo) 03/09/2015  . Sick sinus syndrome ([place]) 03/12/2014  . TGA (transposition of great arteries)   . Allergic rhinitis 01/22/2013  . Rosanna Randy syndrome 2005    Medications- reviewed and updated Current Outpatient Medications  Medication Sig Dispense Refill  . albuterol (VENTOLIN HFA) 108 (90 Base) MCG/ACT inhaler Inhale 2 puffs into the lungs every 6 (six) hours as needed for wheezing or shortness of breath. 1 each 5  . budesonide-formoterol (SYMBICORT) 160-4.5 MCG/ACT inhaler Inhale 2 puffs into the lungs 2 (two) times daily. 1 each 11  . diphenhydrAMINE (BENADRYL) 25 mg capsule Take 25 mg by mouth daily.     Marland Kitchen gabapentin (NEURONTIN) 300 MG capsule Take 1 capsule (300 mg total) by mouth at bedtime. 90 capsule 3  . sildenafil (REVATIO) 20 MG tablet Take 1-2 tablets as needed for erectile dysfunction. (Patient not taking: Reported on 03/02/2020) 25 tablet 3   No current facility-administered medications for this visit.     Objective:  There were no vitals taken for this visit. Gen: NAD, resting comfortably CV: RRR no murmurs rubs or gallops Lungs: CTAB no crackles, wheeze, rhonchi Abdomen: soft/nontender/nondistended/normal bowel sounds. No rebound or guarding.  Ext: no edema Skin: warm, dry Neuro: grossly normal, moves  all extremities  ***    Assessment and Plan  Shoulder Pain S:***  A/P: ***     No specialty comments available.  No problem-specific Assessment & Plan notes found for this encounter.   Recommended follow up: ***No follow-ups on file. Future Appointments  Date Time Provider [place]  11/20/2020  9:20 AM Marin Olp, MD [facility]    Lab/Order associations: No diagnosis found.  No orders of the defined types were placed in this encounter.   Time Spent: *** minutes of total time (1:36 AM***- 1:36 AM***) was spent on the date of the encounter performing the following actions: chart review prior to seeing the patient, obtaining history, performing a medically necessary exam, counseling on the treatment plan, placing orders, and documenting in our EHR.   Return precautions advised.  Clyde Lundborg, CMA

## 2020-11-19 NOTE — Patient Instructions (Incomplete)
Health Maintenance Due  Topic Date Due  . Hepatitis C Screening  Never done  . COVID-19 Vaccine (1) Never done  . INFLUENZA VACCINE  Never done   Depression screen [facility] 2/9 06/03/2019  Decreased Interest 0  Down, Depressed, Hopeless 0  PHQ - 2 Score 0  Altered sleeping 3  Tired, decreased energy 3  Change in appetite 0  Feeling bad or failure about yourself  0  Trouble concentrating 0  Moving slowly or fidgety/restless 0  Suicidal thoughts 0  PHQ-9 Score 6  Difficult doing work/chores Not difficult at all

## 2020-11-20 ENCOUNTER — Ambulatory Visit: Payer: BC Managed Care – PPO | Admitting: Family Medicine

## 2020-11-20 ENCOUNTER — Telehealth: Payer: Self-pay

## 2020-11-20 DIAGNOSIS — Z0289 Encounter for other administrative examinations: Secondary | ICD-10-CM

## 2020-11-20 NOTE — Telephone Encounter (Signed)
Patient no showed appointment this morning and he called in to reschedule the appointment advised I could do Monday at 1 PM. John Myers then states "well is this appointment going to be a waste of my time?" To which I explained I do not know what all Dr.Hunter will be doing but they can take x-rays of his shoulder if need to be, then patient says "well sounds like a waste of my time because obviously y'all do not know what y'all are doing there" then proceeded to hang up.

## 2020-11-20 NOTE — Telephone Encounter (Signed)
Patient will have to have a conversation with Marcie Bal prior to being see by Dr. Yong Channel. Call patient, No answer will try to call patient again.

## 2020-11-22 ENCOUNTER — Encounter: Payer: Self-pay | Admitting: Family Medicine

## 2021-01-31 DIAGNOSIS — Z950 Presence of cardiac pacemaker: Secondary | ICD-10-CM | POA: Diagnosis not present

## 2021-01-31 DIAGNOSIS — I495 Sick sinus syndrome: Secondary | ICD-10-CM | POA: Diagnosis not present

## 2021-01-31 DIAGNOSIS — I5042 Chronic combined systolic (congestive) and diastolic (congestive) heart failure: Secondary | ICD-10-CM | POA: Diagnosis not present

## 2021-01-31 DIAGNOSIS — Q203 Discordant ventriculoarterial connection: Secondary | ICD-10-CM | POA: Diagnosis not present

## 2021-09-14 DIAGNOSIS — Z20822 Contact with and (suspected) exposure to covid-19: Secondary | ICD-10-CM | POA: Diagnosis not present

## 2021-10-03 DIAGNOSIS — I4510 Unspecified right bundle-branch block: Secondary | ICD-10-CM | POA: Diagnosis not present

## 2021-10-03 DIAGNOSIS — I499 Cardiac arrhythmia, unspecified: Secondary | ICD-10-CM | POA: Diagnosis not present

## 2021-10-03 DIAGNOSIS — I498 Other specified cardiac arrhythmias: Secondary | ICD-10-CM | POA: Diagnosis not present

## 2021-10-03 DIAGNOSIS — Z8774 Personal history of (corrected) congenital malformations of heart and circulatory system: Secondary | ICD-10-CM | POA: Diagnosis not present

## 2021-10-03 DIAGNOSIS — Q221 Congenital pulmonary valve stenosis: Secondary | ICD-10-CM | POA: Diagnosis not present

## 2021-10-03 DIAGNOSIS — I495 Sick sinus syndrome: Secondary | ICD-10-CM | POA: Diagnosis not present

## 2021-10-03 DIAGNOSIS — Z950 Presence of cardiac pacemaker: Secondary | ICD-10-CM | POA: Diagnosis not present

## 2021-10-03 DIAGNOSIS — I4519 Other right bundle-branch block: Secondary | ICD-10-CM | POA: Diagnosis not present

## 2021-10-03 DIAGNOSIS — Z8679 Personal history of other diseases of the circulatory system: Secondary | ICD-10-CM | POA: Diagnosis not present

## 2021-10-03 DIAGNOSIS — Z9889 Other specified postprocedural states: Secondary | ICD-10-CM | POA: Diagnosis not present

## 2021-10-03 DIAGNOSIS — M25512 Pain in left shoulder: Secondary | ICD-10-CM | POA: Diagnosis not present

## 2021-10-03 DIAGNOSIS — Q258 Other congenital malformations of other great arteries: Secondary | ICD-10-CM | POA: Diagnosis not present

## 2021-10-17 DIAGNOSIS — G894 Chronic pain syndrome: Secondary | ICD-10-CM | POA: Diagnosis not present

## 2021-10-17 DIAGNOSIS — M25512 Pain in left shoulder: Secondary | ICD-10-CM | POA: Diagnosis not present

## 2021-10-25 DIAGNOSIS — Z9889 Other specified postprocedural states: Secondary | ICD-10-CM | POA: Diagnosis not present

## 2021-10-25 DIAGNOSIS — Q243 Pulmonary infundibular stenosis: Secondary | ICD-10-CM | POA: Diagnosis not present

## 2021-10-25 DIAGNOSIS — Q203 Discordant ventriculoarterial connection: Secondary | ICD-10-CM | POA: Diagnosis not present

## 2021-10-25 DIAGNOSIS — I495 Sick sinus syndrome: Secondary | ICD-10-CM | POA: Diagnosis not present

## 2021-10-25 DIAGNOSIS — I081 Rheumatic disorders of both mitral and tricuspid valves: Secondary | ICD-10-CM | POA: Diagnosis not present

## 2021-12-23 DIAGNOSIS — K047 Periapical abscess without sinus: Secondary | ICD-10-CM | POA: Diagnosis not present

## 2022-01-29 ENCOUNTER — Other Ambulatory Visit: Payer: Self-pay | Admitting: Family Medicine

## 2022-01-29 NOTE — Telephone Encounter (Unsigned)
Pt needs to schedule an appointment, will send in one refill of each medication requested. No further refills till seen.

## 2022-01-29 NOTE — Telephone Encounter (Addendum)
Pt is also requesting a referral to an allergy specialist as well as these two refills.  Pt states he would prefer a specialty office near [place], [place].  Does pt need to come in for CPE or OV? Last seen: MyChart 06/14/20    FO Rep explained that PCP may need to see pt in the office for refill since it has been over a year. Pt stated PCP is not qualified to care for general care ("look at my heart") and questioned why he would need to be seen.  Pt stated understanding the policy, but was unhappy with it.  Please route to [place] if an appointment needs to be scheduled.

## 2022-02-07 ENCOUNTER — Ambulatory Visit (INDEPENDENT_AMBULATORY_CARE_PROVIDER_SITE_OTHER): Payer: BC Managed Care – PPO | Admitting: Family Medicine

## 2022-02-07 ENCOUNTER — Encounter: Payer: Self-pay | Admitting: Family Medicine

## 2022-02-07 VITALS — BP 112/70 | HR 71 | Temp 98.4°F | Ht 72.0 in | Wt 172.2 lb

## 2022-02-07 DIAGNOSIS — J4550 Severe persistent asthma, uncomplicated: Secondary | ICD-10-CM

## 2022-02-07 DIAGNOSIS — I495 Sick sinus syndrome: Secondary | ICD-10-CM

## 2022-02-07 DIAGNOSIS — J3089 Other allergic rhinitis: Secondary | ICD-10-CM | POA: Diagnosis not present

## 2022-02-07 MED ORDER — ALBUTEROL SULFATE HFA 108 (90 BASE) MCG/ACT IN AERS: 2.0000 | INHALATION_SPRAY | Freq: Four times a day (QID) | RESPIRATORY_TRACT | 2 refills | Status: DC | PRN

## 2022-02-07 MED ORDER — BUDESONIDE-FORMOTEROL FUMARATE 160-4.5 MCG/ACT IN AERO: 2.0000 | INHALATION_SPRAY | Freq: Two times a day (BID) | RESPIRATORY_TRACT | 11 refills | Status: DC

## 2022-02-07 MED ORDER — MONTELUKAST SODIUM 10 MG PO TABS: 10.0000 mg | ORAL_TABLET | Freq: Every day | ORAL | 5 refills | Status: DC

## 2022-02-07 NOTE — Patient Instructions (Addendum)
Continue symbicort (maintain overall feeling better long term) Albuterol as needed (rescue)  Add singulair/montelukast for allergies/asthma We will call you within two weeks about your referral to allergist in [place]. If you do not hear within 2 weeks, give [place] a call.    Asthma control goals:  Full participation in all desired activities (may need albuterol before activity) Albuterol use two time or less a week on average (not counting use with activity) Cough interfering with sleep two time or less a month Oral steroids no more than once a year No hospitalizations  Recommended follow up: Return in about 6 months (around 08/09/2022) for physical or sooner if needed.Schedule b4 you leave.

## 2022-02-07 NOTE — Progress Notes (Signed)
Phone [phone] In person visit   Subjective:   John Myers is a 36 y.o. year old very pleasant male patient who presents for/with See problem oriented charting Chief Complaint  Patient presents with   allergy referral     Pt has been having attacks and would like a specialist.    Past Medical History-  Patient Active Problem List   Diagnosis Date Noted   Asthma 06/14/2020    Priority: High   Sick sinus syndrome ([place]) 03/12/2014    Priority: High   TGA (transposition of great arteries)     Priority: High   BPPV (benign paroxysmal positional vertigo) 03/09/2015    Priority: Low   Allergic rhinitis 01/22/2013    Priority: Low   Left shoulder pain 06/14/2020   Rosanna Randy syndrome 2005    Medications- reviewed and updated Current Outpatient Medications  Medication Sig Dispense Refill   albuterol (VENTOLIN HFA) 108 (90 Base) MCG/ACT inhaler Inhale 2 puffs into the lungs every 6 (six) hours as needed for wheezing or shortness of breath. 1 each 2   diphenhydrAMINE (BENADRYL) 25 mg capsule Take 25 mg by mouth daily.     gabapentin (NEURONTIN) 300 MG capsule Take 1 capsule (300 mg total) by mouth at bedtime. 90 capsule 3   montelukast (SINGULAIR) 10 MG tablet Take 1 tablet (10 mg total) by mouth at bedtime. 30 tablet 5   sildenafil (REVATIO) 20 MG tablet Take 1-2 tablets as needed for erectile dysfunction. 25 tablet 3   budesonide-formoterol (SYMBICORT) 160-4.5 MCG/ACT inhaler Inhale 2 puffs into the lungs 2 (two) times daily. 10.2 g 11   No current facility-administered medications for this visit.     Objective:  BP 112/70   Pulse 71   Temp 98.4 F (36.9 C)   Ht 6' (1.829 m)   Wt 172 lb 3.2 oz (78.1 kg)   SpO2 98%   BMI 23.35 kg/m  Gen: NAD, resting comfortably Edematous nasal turbinates, some signs of drainage in pharynx, tympanic membranes normal CV: RRR with stable murmur  lungs: CTAB no crackles, wheeze, rhonchi Ext: no edema Skin: warm, dry    Assessment  and Plan   #social update- pursuing disability for transposition of great arteries and ongoing left shoulder pain after pacemaker placement for sick sinus syndrome- has not worked in 2 years. Main concern today is breathing better and controlling allergies  # asthma/allergies S: Maintenance Medication:  recommended symbicort at last visit- unfortunately $109 even with goodrx.  With recent issues restarted on 01/29/22 twice a day- somewhat helpful. Prior without symbicort to that having episodes waking up not being able to breath- was needing albuterol in middle of the night.  - Did have some outdoor exposure to grass and wore a respirator but still triggered issues- tried zyrtec, allegra (tried consistent use), benadryl works best. Also doing flonase. With allergies- getting watery nose, sneezing, coughing and congestion. Issues are worse on hot days- even with mild heat today is feeling drained/more congested/harder to breath  A/P: asthma much improved since starting symbicort- needs to use this consistently because has had ongoing issues off medicine in recent years.  I like his idea of using goodrx to help with price  Allergies poorly controlled even with max otc medicine- allegra plus flonase and benadryl at times. Refer to allergist in [place]. Add singulair. He is interested in allergy shots - wants to enjoy each day more especially with challenges he faces from cardiac perspective and pain in shoulder  #  Sick sinus syndrome-follows now with [facility].  Pacemaker in place but has had chronic pain in left shoulder since this was placed.  Noted-continue cardiology follow-up  Recommended follow up: Return in about 6 months (around 08/09/2022) for physical or sooner if needed.Schedule b4 you leave.  Lab/Order associations:   ICD-10-CM   1. Seasonal allergic rhinitis due to other allergic trigger  J30.89 Ambulatory referral to Allergy    2. Severe persistent asthma, unspecified whether  complicated  Q67.61 Ambulatory referral to Allergy    3. Sick sinus syndrome (HCC) Chronic I49.5       Meds ordered this encounter  Medications   albuterol (VENTOLIN HFA) 108 (90 Base) MCG/ACT inhaler    Sig: Inhale 2 puffs into the lungs every 6 (six) hours as needed for wheezing or shortness of breath.    Dispense:  1 each    Refill:  2   budesonide-formoterol (SYMBICORT) 160-4.5 MCG/ACT inhaler    Sig: Inhale 2 puffs into the lungs 2 (two) times daily.    Dispense:  10.2 g    Refill:  11   montelukast (SINGULAIR) 10 MG tablet    Sig: Take 1 tablet (10 mg total) by mouth at bedtime.    Dispense:  30 tablet    Refill:  5   Return precautions advised.  Garret Reddish, MD

## 2022-03-18 NOTE — Progress Notes (Unsigned)
New Patient Note  RE: John Myers MRN: [id number] DOB: February 28, 1986 Date of Office Visit: 03/19/2022  Consult requested by: Marin Olp, MD Primary care provider: Marin Olp, MD  Chief Complaint: No chief complaint on file.  History of Present Illness: I had the pleasure of seeing John Myers for initial evaluation at the [facility] on 03/18/2022. He is a 36 y.o. male, who is referred here by Marin Olp, MD for the evaluation of asthma and allergies.  He reports symptoms of *** chest tightness, shortness of breath, coughing, wheezing, nocturnal awakenings for *** years. Current medications include *** which help. He reports *** using aerochamber with inhalers. He tried the following inhalers: ***. Main triggers are ***allergies, infections, weather changes, smoke, exercise, pet exposure. In the last month, frequency of symptoms: ***x/week. Frequency of nocturnal symptoms: ***x/month. Frequency of SABA use: ***x/week. Interference with physical activity: ***. Sleep is ***disturbed. In the last 12 months, emergency room visits/urgent care visits/doctor office visits or hospitalizations due to respiratory issues: ***. In the last 12 months, oral steroids courses: ***. Lifetime history of hospitalization for respiratory issues: ***. Prior intubations: ***. Asthma was diagnosed at age *** by ***. History of pneumonia: ***. He was evaluated by allergist ***pulmonologist in the past. Smoking exposure: ***. Up to date with flu vaccine: ***. Up to date with pneumonia vaccine: ***. Up to date with COVID-19 vaccine: ***. Prior Covid-19 infection: ***. History of reflux: ***.  He reports symptoms of ***. Symptoms have been going on for *** years. The symptoms are present *** all year around with worsening in ***. Other triggers include exposure to ***. Anosmia: ***. Headache: ***. He has used *** with ***fair improvement in symptoms. Sinus infections: ***. Previous  work up includes: ***. Previous ENT evaluation: ***. Previous sinus imaging: ***. History of nasal polyps: ***. Last eye exam: ***. History of reflux: ***.  02/07/2022 PCP visit: "# asthma/allergies S: Maintenance Medication:  recommended symbicort at last visit- unfortunately $109 even with goodrx.  With recent issues restarted on 01/29/22 twice a day- somewhat helpful. Prior without symbicort to that having episodes waking up not being able to breath- was needing albuterol in middle of the night.  - Did have some outdoor exposure to grass and wore a respirator but still triggered issues- tried zyrtec, allegra (tried consistent use), benadryl works best. Also doing flonase. With allergies- getting watery nose, sneezing, coughing and congestion. Issues are worse on hot days- even with mild heat today is feeling drained/more congested/harder to breath   A/P: asthma much improved since starting symbicort- needs to use this consistently because has had ongoing issues off medicine in recent years.  I like his idea of using goodrx to help with price   Allergies poorly controlled even with max otc medicine- allegra plus flonase and benadryl at times. Refer to allergist in [place]. Add singulair. He is interested in allergy shots - wants to enjoy each day more especially with challenges he faces from cardiac perspective and pain in shoulder"  Assessment and Plan: [name] is a 36 y.o. male with: No problem-specific Assessment & Plan notes found for this encounter.  No follow-ups on file.  No orders of the defined types were placed in this encounter.  Lab Orders  No laboratory test(s) ordered today    Other allergy screening: Asthma: {Blank single:19197::"yes","no"} Rhino conjunctivitis: {Blank single:19197::"yes","no"} Food allergy: {Blank single:19197::"yes","no"} Medication allergy: {Blank single:19197::"yes","no"} Hymenoptera allergy: {Blank single:19197::"yes","no"} Urticaria: {Blank  single:19197::"yes","no"} Eczema:{Blank single:19197::"yes","no"} History of recurrent infections suggestive of immunodeficency: {Blank single:19197::"yes","no"}  Diagnostics: Spirometry:  Tracings reviewed. His effort: {Blank single:19197::"Good reproducible efforts.","It was hard to get consistent efforts and there is a question as to whether this reflects a maximal maneuver.","Poor effort, data can not be interpreted."} FVC: ***L FEV1: ***L, ***% predicted FEV1/FVC ratio: ***% Interpretation: {Blank single:19197::"Spirometry consistent with mild obstructive disease","Spirometry consistent with moderate obstructive disease","Spirometry consistent with severe obstructive disease","Spirometry consistent with possible restrictive disease","Spirometry consistent with mixed obstructive and restrictive disease","Spirometry uninterpretable due to technique","Spirometry consistent with normal pattern","No overt abnormalities noted given today's efforts"}.  Please see scanned spirometry results for details.  Skin Testing: {Blank single:19197::"Select foods","Environmental allergy panel","Environmental allergy panel and select foods","Food allergy panel","None","Deferred due to recent antihistamines use"}. *** Results discussed with patient/family.   Past Medical History: Patient Active Problem List   Diagnosis Date Noted  . Asthma 06/14/2020  . Left shoulder pain 06/14/2020  . BPPV (benign paroxysmal positional vertigo) 03/09/2015  . Sick sinus syndrome ([place]) 03/12/2014  . TGA (transposition of great arteries)   . Allergic rhinitis 01/22/2013  . John Myers syndrome 2005   Past Medical History:  Diagnosis Date  . Allergic rhinitis   . Anginal pain ([place])   . Cancer ([place])   . Depression    never treated with meds   . Dyspnea   . Dysrhythmia    PVC'S  . Finger injury   . Gilbert syndrome 2005  . Premature ventricular contractions   . Presence of permanent cardiac pacemaker   . Right  ventricular dysfunction   . Sick sinus syndrome ([place]) 2014   s/p PPM  . TGA (transposition of great arteries)    surgical correction at [facility] at 8 months of age   Past Surgical History: Past Surgical History:  Procedure Laterality Date  . CLOSED REDUCTION METACARPAL WITH PERCUTANEOUS PINNING Right 07/03/2016   Procedure: RIGHT HAND CLOSED REDUCTION AND PINNING;  Surgeon: Iran Planas, MD;  Location: [facility];  Service: Orthopedics;  Laterality: Right;  AXILLARY BLOCK WITH SEDATION  . PACEMAKER INSERTION  03/08/13   MDT Adapta SR (atrial) PPM implanted by Dr Geanie Kenning at [facility] for sick sinus syndrome  . SHOULDER ARTHROSCOPY WITH CAPSULORRHAPHY Left 03/26/2016   Procedure: LEFT SHOULDER ATHROSCOPY WITH DEBRIDEMENT, chromoplasty, and bursectomy.;  Surgeon: Sydnee Cabal, MD;  Location: [facility];  Service: Orthopedics;  Laterality: Left;  ANESTHESIA: GENERAL/INTERSCALENE BLOCK  . SUBACROMIAL DECOMPRESSION Left 03/26/2016   Procedure: LEFT SUBACROMIAL DECOMPRESSION;  Surgeon: Sydnee Cabal, MD;  Location: [facility];  Service: Orthopedics;  Laterality: Left;  . TRANSPOSITION OF GREAT VESSELS REPAIR     Senning procedure performed at 22 months of age at [facility].  . WISDOM TOOTH EXTRACTION     Medication List:  Current Outpatient Medications  Medication Sig Dispense Refill  . albuterol (VENTOLIN HFA) 108 (90 Base) MCG/ACT inhaler Inhale 2 puffs into the lungs every 6 (six) hours as needed for wheezing or shortness of breath. 1 each 2  . budesonide-formoterol (SYMBICORT) 160-4.5 MCG/ACT inhaler Inhale 2 puffs into the lungs 2 (two) times daily. 10.2 g 11  . diphenhydrAMINE (BENADRYL) 25 mg capsule Take 25 mg by mouth daily.    Marland Kitchen gabapentin (NEURONTIN) 300 MG capsule Take 1 capsule (300 mg total) by mouth at bedtime. 90 capsule 3  . montelukast (SINGULAIR) 10 MG tablet Take 1 tablet (10 mg total) by mouth at bedtime. 30 tablet 5  . sildenafil (REVATIO) 20 MG tablet Take 1-2 tablets as needed for erectile  dysfunction. 25 tablet 3   No current facility-administered medications for this visit.   Allergies: No Known Allergies Social History: Social History   Socioeconomic History  . Marital status: Single    Spouse name: Not on file  . Number of children: Not on file  . Years of education: Not on file  . Highest education level: Not on file  Occupational History  . Not on file  Tobacco Use  . Smoking status: Never  . Smokeless tobacco: Never  Vaping Use  . Vaping Use: Never used  Substance and Sexual Activity  . Alcohol use: No  . Drug use: No  . Sexual activity: Yes    Birth control/protection: Condom    Comment: condom if not in long term relationship  Other Topics Concern  . Not on file  Social History Narrative   Single lives in [place] in [place] in 2023. 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 Smoking: *** Occupation: ***  Environmental HistoryFreight forwarder in the house: Estate agent in the family room: {Blank single:19197::"yes","no"} Carpet in the bedroom: {Blank single:19197::"yes","no"} Heating: {Blank single:19197::"electric","gas","heat pump"} Cooling: {Blank single:19197::"central","window","heat pump"} Pet: {Blank single:19197::"yes ***","no"}  Family History: Family History  Problem Relation Age of Onset  . Colon cancer Mother   . Healthy Father   . Healthy Sister   . Healthy Brother   . Heart  attack Paternal Grandfather        in late 71s or 56s  . Hyperlipidemia Paternal Grandfather   . Hypertension Paternal Grandfather   . Breast cancer Maternal Grandmother    Problem                               Relation Asthma                                   *** Eczema                                *** Food allergy                          *** Allergic rhino conjunctivitis     ***  Review of Systems  Constitutional:  Negative for appetite change, chills, fever and unexpected weight change.  HENT:  Negative for congestion and rhinorrhea.   Eyes:  Negative for itching.  Respiratory:  Negative for cough, chest tightness, shortness of breath and wheezing.   Cardiovascular:  Negative for chest pain.  Gastrointestinal:  Negative for abdominal pain.  Genitourinary:  Negative for difficulty urinating.  Skin:  Negative for rash.  Neurological:  Negative for headaches.   Objective: There were no vitals taken for this  visit. There is no height or weight on file to calculate BMI. Physical Exam Vitals and nursing note reviewed.  Constitutional:      Appearance: Normal appearance. He is well-developed.  HENT:     Head: Normocephalic and atraumatic.     Right Ear: Tympanic membrane and external ear normal.     Left Ear: Tympanic membrane and external ear normal.     Nose: Nose normal.     Mouth/Throat:     Mouth: Mucous membranes are moist.     Pharynx: Oropharynx is clear.  Eyes:     Conjunctiva/sclera: Conjunctivae normal.  Cardiovascular:     Rate and Rhythm: Normal rate and regular rhythm.     Heart sounds: Normal heart sounds. No murmur heard.    No friction rub. No gallop.  Pulmonary:     Effort: Pulmonary effort is normal.     Breath sounds: Normal breath sounds. No wheezing, rhonchi or rales.  Musculoskeletal:     Cervical back: Neck supple.  Skin:    General: Skin is warm.     Findings: No rash.  Neurological:     Mental Status: He is alert and oriented to person,  place, and time.  Psychiatric:        Behavior: Behavior normal.  The plan was reviewed with the patient/family, and all questions/concerned were addressed.  It was my pleasure to see Hurschel today and participate in his care. Please feel free to contact me with any questions or concerns.  Sincerely,  Rexene Alberts, DO Allergy & Immunology  [facility] office: [place] office: [phone]

## 2022-03-19 ENCOUNTER — Ambulatory Visit (INDEPENDENT_AMBULATORY_CARE_PROVIDER_SITE_OTHER): Payer: BC Managed Care – PPO | Admitting: Allergy

## 2022-03-19 ENCOUNTER — Other Ambulatory Visit: Payer: Self-pay

## 2022-03-19 ENCOUNTER — Encounter: Payer: Self-pay | Admitting: Allergy

## 2022-03-19 VITALS — BP 116/78 | HR 72 | Temp 98.2°F | Resp 16 | Ht 70.47 in | Wt 174.8 lb

## 2022-03-19 DIAGNOSIS — J3089 Other allergic rhinitis: Secondary | ICD-10-CM | POA: Diagnosis not present

## 2022-03-19 DIAGNOSIS — J4520 Mild intermittent asthma, uncomplicated: Secondary | ICD-10-CM | POA: Diagnosis not present

## 2022-03-19 DIAGNOSIS — K219 Gastro-esophageal reflux disease without esophagitis: Secondary | ICD-10-CM

## 2022-03-19 DIAGNOSIS — R1310 Dysphagia, unspecified: Secondary | ICD-10-CM | POA: Diagnosis not present

## 2022-03-19 DIAGNOSIS — J45909 Unspecified asthma, uncomplicated: Secondary | ICD-10-CM

## 2022-03-19 MED ORDER — PANTOPRAZOLE SODIUM 40 MG PO TBEC: 40.0000 mg | DELAYED_RELEASE_TABLET | Freq: Every day | ORAL | 2 refills | Status: DC

## 2022-03-19 MED ORDER — RYALTRIS 665-25 MCG/ACT NA SUSP: 1.0000 | Freq: Two times a day (BID) | NASAL | 5 refills | Status: DC

## 2022-03-19 MED ORDER — TRELEGY ELLIPTA 200-62.5-25 MCG/ACT IN AEPB: 1.0000 | INHALATION_SPRAY | Freq: Every day | RESPIRATORY_TRACT | 5 refills | Status: DC

## 2022-03-19 NOTE — Assessment & Plan Note (Addendum)
Dysphagia with food getting stuck such as meats since 2011. scheduled to see GI. No prior EGD/biopsy.  Keep GI appointment.  Symptoms concerning for eosinophilic esophagitis - handout given.

## 2022-03-19 NOTE — Assessment & Plan Note (Signed)
Perennial rhino conjunctivitis symptoms with worsening in the spring and summer. No recent allergy testing. No prior AIT.  Today's skin prick testing showed: Positive to grass, ragweed, weed, trees, mold, dust mites.  Start environmental control measures as below.  Use over the counter antihistamines such as Zyrtec (cetirizine), Claritin (loratadine), Allegra (fexofenadine), or Xyzal (levocetirizine) daily as needed. May take twice a day during allergy flares. May switch antihistamines every few months.  Start Ryaltris (olopatadine + mometasone nasal spray combination) 1-2 sprays per nostril twice a day. Sample given.  This replaces your other nasal sprays.  If this works well for you, then have [facility] ship the medication to your home - prescription already sent in.   Consider allergy injections for long term control if above medications do not help the symptoms - handout given.

## 2022-03-19 NOTE — Assessment & Plan Note (Signed)
Diagnosed with asthma 30+ years ago but noticed worsening symptoms lately especially when he comes to visits his girlfriend in the area. Currently on Symbicort 166mg 2 puffs twice a day and using albuterol daily. Covid-19 in 2022. Reflux symptoms and only takes OTC meds. Medical history significant for transposition of great arteries, stage 2 heart failure, pacemaker - follows with cards. No recent CXR.  Today's spirometry showed: normal pattern with 8% improvement in FEV1 post bronchodilator treatment. Clinically feeling improved.   Discussed with patient that he has multiple other medical conditions that maybe contributing to his symptoms.  . Daily controller medication(s): Start Trelegy 2066m 1 puff once a day and rinse mouth after each use. Sample given.  . Stop Symbicort. . Continue Singulair (montelukast) '10mg'$  daily at night. . May use albuterol rescue inhaler 2 puffs every 4 to 6 hours as needed for shortness of breath, chest tightness, coughing, and wheezing. May use albuterol rescue inhaler 2 puffs 5 to 15 minutes prior to strenuous physical activities. Monitor frequency of use.  . Get spirometry at next visit. . If no improvement will get bloodwork next to see if qualifies for biologics. Will get CXR next as well.

## 2022-03-19 NOTE — Assessment & Plan Note (Signed)
   See handout for lifestyle and dietary modifications.  Start Protonix '40mg'$  daily in the morning. Nothing to eat or drink for 30 minutes afterwards.

## 2022-03-19 NOTE — Patient Instructions (Addendum)
Breathing  You have multiple other medical conditions that is probably contributing to your symptoms.  It is important to have good control on those conditions.  Daily controller medication(s): Start Trelegy 219mg 1 puff once a day and rinse mouth after each use. Sample given.  Stop Symbicort. Continue Singulair (montelukast) '10mg'$  daily at night. May use albuterol rescue inhaler 2 puffs every 4 to 6 hours as needed for shortness of breath, chest tightness, coughing, and wheezing. May use albuterol rescue inhaler 2 puffs 5 to 15 minutes prior to strenuous physical activities. Monitor frequency of use.  Breathing control goals:  Full participation in all desired activities (may need albuterol before activity) Albuterol use two times or less a week on average (not counting use with activity) Cough interfering with sleep two times or less a month Oral steroids no more than once a year No hospitalizations   Today's skin testing showed: Positive to grass, ragweed, weed, trees, mold, dust mites. Negative to common foods.  Environmental allergies Start environmental control measures as below. Use over the counter antihistamines such as Zyrtec (cetirizine), Claritin (loratadine), Allegra (fexofenadine), or Xyzal (levocetirizine) daily as needed. May take twice a day during allergy flares. May switch antihistamines every few months. Start Ryaltris (olopatadine + mometasone nasal spray combination) 1-2 sprays per nostril twice a day. Sample given. This replaces your other nasal sprays. If this works well for you, then have [facility] ship the medication to your home - prescription already sent in.  Consider allergy injections for long term control if above medications do not help the symptoms - handout given.   Heartburn: See handout for lifestyle and dietary modifications. Start Protonix '40mg'$  daily in the morning. Nothing to eat or drink for 30 minutes afterwards. Keep GI appointment. Symptoms  concerning for eosinophilic esophagitis - handout given Needs biopsy to diagnose.   Follow up in 3 months or sooner if needed.   Reducing Pollen Exposure Pollen seasons: trees (spring), grass (summer) and ragweed/weeds (fall). Keep windows closed in your home and car to lower pollen exposure.  Install air conditioning in the bedroom and throughout the house if possible.  Avoid going out in dry windy days - especially early morning. Pollen counts are highest between 5 - 10 AM and on dry, hot and windy days.  Save outside activities for late afternoon or after a heavy rain, when pollen levels are lower.  Avoid mowing of grass if you have grass pollen allergy. Be aware that pollen can also be transported indoors on people and pets.  Dry your clothes in an automatic dryer rather than hanging them outside where they might collect pollen.  Rinse hair and eyes before bedtime. Mold Control Mold and fungi can grow on a variety of surfaces provided certain temperature and moisture conditions exist.  Outdoor molds grow on plants, decaying vegetation and soil. The major outdoor mold, Alternaria and Cladosporium, are found in very high numbers during hot and dry conditions. Generally, a late summer - fall peak is seen for common outdoor fungal spores. Rain will temporarily lower outdoor mold spore count, but counts rise rapidly when the rainy period ends. The most important indoor molds are Aspergillus and Penicillium. Dark, humid and poorly ventilated basements are ideal sites for mold growth. The next most common sites of mold growth are the bathroom and the kitchen. Outdoor (Seasonal) Mold Control Use air conditioning and keep windows closed. Avoid exposure to decaying vegetation. Avoid leaf raking. Avoid grain handling. Consider wearing a face mask if  working in [employer] areas.  Indoor (Perennial) Mold Control  Maintain humidity below 50%. Get rid of mold growth on hard surfaces with water, detergent  and, if necessary, 5% bleach (do not mix with other cleaners). Then dry the area completely. If mold covers an area more than 10 square feet, consider hiring an indoor environmental professional. For clothing, washing with soap and water is best. If moldy items cannot be cleaned and dried, throw them away. Remove sources e.g. contaminated carpets. Repair and seal leaking roofs or pipes. Using dehumidifiers in damp basements may be helpful, but empty the water and clean units regularly to prevent mildew from forming. All rooms, especially basements, bathrooms and kitchens, require ventilation and cleaning to deter mold and mildew growth. Avoid carpeting on concrete or damp floors, and storing items in damp areas. Control of House Dust Mite Allergen Dust mite allergens are a common trigger of allergy and asthma symptoms. While they can be found throughout the house, these microscopic creatures thrive in warm, humid environments such as bedding, upholstered furniture and carpeting. Because so much time is spent in the bedroom, it is essential to reduce mite levels there.  Encase pillows, mattresses, and box springs in special allergen-proof fabric covers or airtight, zippered plastic covers.  Bedding should be washed weekly in hot water (130 F) and dried in a hot dryer. Allergen-proof covers are available for comforters and pillows that can't be regularly washed.  Wash the allergy-proof covers every few months. Minimize clutter in the bedroom. Keep pets out of the bedroom.  Keep humidity less than 50% by using a dehumidifier or air conditioning. You can buy a humidity measuring device called a hygrometer to monitor this.  If possible, replace carpets with hardwood, linoleum, or washable area rugs. If that's not possible, vacuum frequently with a vacuum that has a HEPA filter. Remove all upholstered furniture and non-washable window drapes from the bedroom. Remove all non-washable stuffed toys from the  bedroom.  Wash stuffed toys weekly.

## 2022-03-29 ENCOUNTER — Telehealth: Payer: Self-pay | Admitting: Allergy

## 2022-03-29 NOTE — Telephone Encounter (Signed)
Was not able to complete the PA today due to it asking if the patient has tried and failed taking any over the counter medications for acid reflux. I left a message for the patient to call back so I can complete the PA. Omeprazole does not need a PA please advise if you would like to switch his medication. If he has not tried/ fail any other medications there is a high chance for denial.

## 2022-03-29 NOTE — Telephone Encounter (Signed)
John Myers called in and stated that [facility] informed him that [place] needs a prior authorization.  John Myers states he would really like this taken care of.  Please advise.

## 2022-03-31 MED ORDER — OMEPRAZOLE 40 MG PO CPDR: 40.0000 mg | DELAYED_RELEASE_CAPSULE | Freq: Every day | ORAL | 2 refills | Status: DC

## 2022-03-31 NOTE — Telephone Encounter (Signed)
Please call patient.  Sent in omeprazole '40mg'$ .  If not covered, he may have to pick it up over the counter as his insurance is denying the over the counter meds.  Thank you.

## 2022-04-01 NOTE — Telephone Encounter (Signed)
Lm informing pt of the change and to call us if she had any qustions

## 2022-04-24 NOTE — Telephone Encounter (Signed)
Patient called back and said that has not heard anything from [place] in a month and needs [place] to contact [facility] to do a prior [place]. [phone].. please call him

## 2022-04-24 NOTE — Telephone Encounter (Addendum)
Called the number provided and left a message for patient to call the office regarding this matter. The number in his chart does not match the number provided which could be the reason he has not heard anything as the nurses have called the number in his chart. Please verify patients number as well as the below message regarding a new medication being sent in.

## 2022-04-29 ENCOUNTER — Encounter: Payer: Self-pay | Admitting: *Deleted

## 2022-04-29 NOTE — Telephone Encounter (Signed)
MyChart message sent to patient.

## 2022-04-30 ENCOUNTER — Other Ambulatory Visit: Payer: Self-pay

## 2022-04-30 MED ORDER — ALBUTEROL SULFATE HFA 108 (90 BASE) MCG/ACT IN AERS
2.0000 | INHALATION_SPRAY | RESPIRATORY_TRACT | 1 refills | Status: DC | PRN
Start: 2022-04-30 — End: 2023-11-20

## 2022-04-30 MED ORDER — TRELEGY ELLIPTA 200-62.5-25 MCG/ACT IN AEPB: 1.0000 | INHALATION_SPRAY | Freq: Every day | RESPIRATORY_TRACT | 5 refills | Status: DC

## 2022-05-15 DIAGNOSIS — Q203 Discordant ventriculoarterial connection: Secondary | ICD-10-CM | POA: Diagnosis not present

## 2022-05-15 DIAGNOSIS — I495 Sick sinus syndrome: Secondary | ICD-10-CM | POA: Diagnosis not present

## 2022-05-15 DIAGNOSIS — Z9889 Other specified postprocedural states: Secondary | ICD-10-CM | POA: Diagnosis not present

## 2022-05-15 DIAGNOSIS — I4510 Unspecified right bundle-branch block: Secondary | ICD-10-CM | POA: Diagnosis not present

## 2022-05-15 DIAGNOSIS — I499 Cardiac arrhythmia, unspecified: Secondary | ICD-10-CM | POA: Diagnosis not present

## 2022-05-15 DIAGNOSIS — Z950 Presence of cardiac pacemaker: Secondary | ICD-10-CM | POA: Diagnosis not present

## 2022-05-16 ENCOUNTER — Ambulatory Visit: Payer: BC Managed Care – PPO | Admitting: Allergy

## 2022-05-21 DIAGNOSIS — R12 Heartburn: Secondary | ICD-10-CM | POA: Diagnosis not present

## 2022-05-21 DIAGNOSIS — R1319 Other dysphagia: Secondary | ICD-10-CM | POA: Diagnosis not present

## 2022-06-03 ENCOUNTER — Encounter: Payer: Self-pay | Admitting: *Deleted

## 2022-06-06 DIAGNOSIS — R9431 Abnormal electrocardiogram [ECG] [EKG]: Secondary | ICD-10-CM | POA: Diagnosis not present

## 2022-06-06 DIAGNOSIS — Z950 Presence of cardiac pacemaker: Secondary | ICD-10-CM | POA: Diagnosis not present

## 2022-06-06 DIAGNOSIS — Q203 Discordant ventriculoarterial connection: Secondary | ICD-10-CM | POA: Diagnosis not present

## 2022-06-06 DIAGNOSIS — Q243 Pulmonary infundibular stenosis: Secondary | ICD-10-CM | POA: Diagnosis not present

## 2022-06-06 DIAGNOSIS — I081 Rheumatic disorders of both mitral and tricuspid valves: Secondary | ICD-10-CM | POA: Diagnosis not present

## 2022-06-06 DIAGNOSIS — I495 Sick sinus syndrome: Secondary | ICD-10-CM | POA: Diagnosis not present

## 2022-06-25 ENCOUNTER — Ambulatory Visit: Payer: BC Managed Care – PPO | Admitting: Allergy

## 2022-08-15 ENCOUNTER — Ambulatory Visit (INDEPENDENT_AMBULATORY_CARE_PROVIDER_SITE_OTHER): Payer: BC Managed Care – PPO | Admitting: Family Medicine

## 2022-08-15 ENCOUNTER — Encounter: Payer: Self-pay | Admitting: Family Medicine

## 2022-08-15 VITALS — BP 128/72 | HR 78 | Temp 98.9°F | Ht 70.0 in | Wt 167.4 lb

## 2022-08-15 DIAGNOSIS — Q203 Discordant ventriculoarterial connection: Secondary | ICD-10-CM

## 2022-08-15 DIAGNOSIS — Z0000 Encounter for general adult medical examination without abnormal findings: Secondary | ICD-10-CM | POA: Diagnosis not present

## 2022-08-15 DIAGNOSIS — G8929 Other chronic pain: Secondary | ICD-10-CM

## 2022-08-15 DIAGNOSIS — M25512 Pain in left shoulder: Secondary | ICD-10-CM

## 2022-08-15 DIAGNOSIS — Z79899 Other long term (current) drug therapy: Secondary | ICD-10-CM | POA: Diagnosis not present

## 2022-08-15 DIAGNOSIS — I495 Sick sinus syndrome: Secondary | ICD-10-CM

## 2022-08-15 DIAGNOSIS — K219 Gastro-esophageal reflux disease without esophagitis: Secondary | ICD-10-CM

## 2022-08-15 MED ORDER — MONTELUKAST SODIUM 10 MG PO TABS
10.0000 mg | ORAL_TABLET | Freq: Every day | ORAL | 5 refills | Status: DC
Start: 2022-08-15 — End: 2023-11-13

## 2022-08-15 NOTE — Patient Instructions (Addendum)
Please stop by lab before you go If you have mychart- we will send your results within 3 business days of [place] receiving them.  If you do not have mychart- we will call you about results within 5 business days of [place] receiving them.  *please also note that you will see labs on mychart as soon as they post. I will later go in and write notes on them- will say "notes from Dr. Yong Channel"   Recommended follow up: Return in about 1 year (around 08/16/2023) for physical or sooner if needed.Schedule b4 you leave.

## 2022-08-15 NOTE — Progress Notes (Signed)
Phone: [phone]    Subjective:  Patient presents today for their annual physical. Chief complaint-noted.   See problem oriented charting- ROS- full  review of systems was completed and negative  except for: left shoulder pain, allergies off singulair- nasal congestion and running, some stress dealing with breakup  The following were reviewed and entered/updated in epic: Past Medical History:  Diagnosis Date   Allergic rhinitis    Anginal pain ([place])    Asthma    Cancer ([place])    Depression    never treated with meds    Dyspnea    Dysrhythmia    PVC'S   Finger injury    Gilbert syndrome 2005   Premature ventricular contractions    Presence of permanent cardiac pacemaker    Right ventricular dysfunction    Sick sinus syndrome ([place]) 2014   s/p PPM   TGA (transposition of great arteries)    surgical correction at [facility] at 78 months of age   Patient Active Problem List   Diagnosis Date Noted   Not well controlled asthma without complication 45/85/9292    Priority: High   Sick sinus syndrome ([place]) 03/12/2014    Priority: High   TGA (transposition of great arteries)     Priority: High   Gilbert syndrome 2005    Priority: High   Gastroesophageal reflux disease 03/19/2022    Priority: Medium    Left shoulder pain 06/14/2020    Priority: Medium    BPPV (benign paroxysmal positional vertigo) 03/09/2015    Priority: Low   Other allergic rhinitis 01/22/2013    Priority: Low   Dysphagia 03/19/2022   Past Surgical History:  Procedure Laterality Date   CLOSED REDUCTION METACARPAL WITH PERCUTANEOUS PINNING Right 07/03/2016   Procedure: RIGHT HAND CLOSED REDUCTION AND PINNING;  Surgeon: Iran Planas, MD;  Location: [facility];  Service: Orthopedics;  Laterality: Right;  AXILLARY BLOCK WITH SEDATION   PACEMAKER INSERTION  03/08/13   MDT Adapta SR (atrial) PPM implanted by Dr Geanie Kenning at [facility] for sick sinus syndrome   SHOULDER ARTHROSCOPY WITH CAPSULORRHAPHY Left 03/26/2016    Procedure: LEFT SHOULDER ATHROSCOPY WITH DEBRIDEMENT, chromoplasty, and bursectomy.;  Surgeon: Sydnee Cabal, MD;  Location: [facility];  Service: Orthopedics;  Laterality: Left;  ANESTHESIA: GENERAL/INTERSCALENE BLOCK   SUBACROMIAL DECOMPRESSION Left 03/26/2016   Procedure: LEFT SUBACROMIAL DECOMPRESSION;  Surgeon: Sydnee Cabal, MD;  Location: [facility];  Service: Orthopedics;  Laterality: Left;   TRANSPOSITION OF GREAT VESSELS REPAIR     Senning procedure performed at 63 months of age at [facility].   WISDOM TOOTH EXTRACTION      Family History  Problem Relation Age of Onset   Colon cancer Mother    Healthy Father    Healthy Sister    Healthy Brother    Heart attack Paternal Grandfather        in late 64s or 47s   Hyperlipidemia Paternal Grandfather    Hypertension Paternal Grandfather    Breast cancer Maternal Grandmother     Medications- reviewed and updated Current Outpatient Medications  Medication Sig Dispense Refill   gabapentin (NEURONTIN) 300 MG capsule Take 1 capsule (300 mg total) by mouth at bedtime. 90 capsule 3   Olopatadine-Mometasone (RYALTRIS) 665-25 MCG/ACT SUSP Place 1-2 sprays into the nose in the morning and at bedtime. 29 g 5   albuterol (VENTOLIN HFA) 108 (90 Base) MCG/ACT inhaler Inhale 2 puffs into the lungs every 4 (four) hours as needed for wheezing or shortness of breath. (Patient not  taking: Reported on 08/15/2022) 8 g 1   Fluticasone-Umeclidin-Vilant (TRELEGY ELLIPTA) 200-62.5-25 MCG/ACT AEPB Inhale 1 puff into the lungs daily. (Patient not taking: Reported on 08/15/2022) 28 each 5   montelukast (SINGULAIR) 10 MG tablet Take 1 tablet (10 mg total) by mouth at bedtime. 30 tablet 5   omeprazole (PRILOSEC) 40 MG capsule Take 1 capsule (40 mg total) by mouth daily. (Patient not taking: Reported on 08/15/2022) 30 capsule 2   sildenafil (REVATIO) 20 MG tablet Take 1-2 tablets as needed for erectile dysfunction. (Patient not taking: Reported on 08/15/2022) 25 tablet 3   No  current facility-administered medications for this visit.    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 Oropharynx normal Neck: no thyromegaly CV: RRR no murmurs rubs or gallops Lungs: CTAB no crackles, wheeze, rhonchi Abdomen: soft/nontender/nondistended/normal bowel sounds. No rebound or guarding.  Ext: no edema Skin: warm, dry Neuro: grossly normal, moves all extremities, PERRLA    Assessment and Plan:  36 y.o. male presenting for annual physical.  Health Maintenance counseling: 1. Anticipatory guidance: Patient counseled regarding regular dental exams - advised q6 months- getting this soon, eye exams - no recent exams- reasonable to update,  avoiding smoking and second hand smoke, limiting alcohol to 2 beverages per day- doesn't drink, no illicit drugs.   2. Risk factor reduction:  Advised patient of need for regular exercise and diet rich and fruits and vegetables to reduce risk of heart attack and stroke.  Exercise- some working out- working around shoulder- tough with aerobic type activities- gets winded but baseline with TGA.  Diet/weight management- reasonably healthy weight Wt Readings from Last 3 Encounters:  08/15/22 167 lb 6.4 oz (75.9 kg)  03/19/22 174 lb 12.8 oz (79.3 kg)  02/07/22 172 lb 3.2 oz (78.1 kg)  3. Immunizations/screenings/ancillary studies- declines flu  shot Immunization History  Administered Date(s) Administered   Pneumococcal Polysaccharide-23 07/10/2001   Td 10/10/2002   Tdap 07/08/2013, 08/15/2020  4. Prostate cancer screening- no family history, start at age 30   5. Colon cancer screening - colon cancer in mom at 64 or so- will start his screening at 65 6. Skin cancer screening/prevention- no dermatologist. advised regular sunscreen use. Denies worrisome, changing, or new skin lesions.  7. Testicular cancer screening- advised monthly self exams  8. STD screening- patient opts out- just broke up but not concerned about infidelity from logn term relationship 9. 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 and Senning operation 1988, sick sinus syndrome status post AAI pacemaker in 2014 - Just had visit with [facility] on 06/06/2022  with plan for 1 year follow-up  # Asthma-follows with [facility] for asthma and allergic rhinitis-on Trelegy, Singulair- needs refill on singulair today- will fill- needs to pick up trelegy- appears has refills -also on nasal spray ryaltris  #sparing gabapentin after prior shoulder repair and some lingering nerve pain  # GERD-remains on omeprazole   #gilberts- monitor bilirubin  Recommended follow up: Return in about 1 year (around 08/16/2023) for physical or sooner if needed.Schedule b4 you leave.  Lab/Order associations:NOT fasting   ICD-10-CM   1. Preventative health care  Z00.00     2. Sick sinus syndrome (HCC)  I49.5 CBC with Differential/Platelet    Comprehensive metabolic panel    3. Chronic left shoulder pain  M25.512    G89.29       Meds ordered this encounter  Medications   montelukast (SINGULAIR) 10 MG tablet    Sig: Take 1 tablet (10 mg total) by mouth at  bedtime.    Dispense:  30 tablet    Refill:  5   Return precautions advised.  Garret Reddish, MD

## 2022-08-16 LAB — COMPREHENSIVE METABOLIC PANEL
ALT: 22 U/L (ref 0–53)
AST: 17 U/L (ref 0–37)
Albumin: 5 g/dL (ref 3.5–5.2)
Alkaline Phosphatase: 72 U/L (ref 39–117)
BUN: 12 mg/dL (ref 6–23)
CO2: 28 mEq/L (ref 19–32)
Calcium: 9.6 mg/dL (ref 8.4–10.5)
Chloride: 104 mEq/L (ref 96–112)
Creatinine, Ser: 0.86 mg/dL (ref 0.40–1.50)
GFR: 111.63 mL/min (ref >60.00)
Glucose, Bld: 99 mg/dL (ref 70–99)
Potassium: 3.8 mEq/L (ref 3.5–5.1)
Sodium: 141 mEq/L (ref 135–145)
Total Bilirubin: 1.6 mg/dL — ABNORMAL HIGH (ref 0.2–1.2)
Total Protein: 7.4 g/dL (ref 6.0–8.3)

## 2022-08-16 LAB — CBC WITH DIFFERENTIAL/PLATELET
Basophils Absolute: 0.1 10*3/uL (ref 0.0–0.1)
Basophils Relative: 0.8 % (ref 0.0–3.0)
Eosinophils Absolute: 0.4 10*3/uL (ref 0.0–0.7)
Eosinophils Relative: 5.6 % — ABNORMAL HIGH (ref 0.0–5.0)
HCT: 45.6 % (ref 39.0–52.0)
Hemoglobin: 15.5 g/dL (ref 13.0–17.0)
Lymphocytes Relative: 19.6 % (ref 12.0–46.0)
Lymphs Abs: 1.5 10*3/uL (ref 0.7–4.0)
MCHC: 34.1 g/dL (ref 30.0–36.0)
MCV: 88.9 fl (ref 78.0–100.0)
Monocytes Absolute: 0.8 10*3/uL (ref 0.1–1.0)
Monocytes Relative: 10.6 % (ref 3.0–12.0)
Neutro Abs: 5 10*3/uL (ref 1.4–7.7)
Neutrophils Relative %: 63.4 % (ref 43.0–77.0)
Platelets: 173 10*3/uL (ref 150.0–400.0)
RBC: 5.13 Mil/uL (ref 4.22–5.81)
RDW: 13.5 % (ref 11.5–15.5)
WBC: 7.8 10*3/uL (ref 4.0–10.5)

## 2022-08-16 LAB — VITAMIN B12: Vitamin B-12: 186 pg/mL — ABNORMAL LOW (ref 211–911)

## 2022-08-19 ENCOUNTER — Encounter: Payer: Self-pay | Admitting: Family Medicine

## 2022-08-20 ENCOUNTER — Other Ambulatory Visit: Payer: Self-pay

## 2022-08-20 MED ORDER — CYANOCOBALAMIN 1000 MCG/ML IJ SOLN: INTRAMUSCULAR | 3 refills | Status: DC

## 2022-08-28 ENCOUNTER — Encounter: Payer: Self-pay | Admitting: Family Medicine

## 2022-08-29 ENCOUNTER — Other Ambulatory Visit: Payer: Self-pay

## 2022-08-29 DIAGNOSIS — M25559 Pain in unspecified hip: Secondary | ICD-10-CM

## 2022-09-12 NOTE — Progress Notes (Signed)
John Myers D.[place] Phone: [phone]   Assessment and Plan:     1. Left hip pain 2. Muscle strain of left gluteal region, initial encounter -Chronic with exacerbation, initial sports medicine visit - Most consistent with strain of gluteal musculature/piriformis based on HPI and physical exam - Start meloxicam 15 mg daily x2 weeks.  If still having pain after 2 weeks, complete 3rd-week of meloxicam. May use remaining meloxicam as needed once daily for pain control.  Do not to use additional NSAIDs while taking meloxicam.  May use Tylenol [phone] mg 2 to 3 times a day for breakthrough pain. - Start HEP for gluteal musculature.  Offered physical therapy, however patient states he has had physical therapy for other issues in the past and has not had good experiences, so he does not want to do PT at this time - X-ray obtained in clinic.  My interpretation: No acute fracture or dislocation.  Unremarkable imaging  Other orders - meloxicam (MOBIC) 15 MG tablet; Take 1 tablet (15 mg total) by mouth daily.    Pertinent previous records reviewed include none   Follow Up: 3 to 4 weeks for reevaluation.  Could consider gluteal muscle ultrasound and CSI if no improvement or worsening of symptoms   Subjective:   I, John Myers, am serving as a Education administrator for Doctor Glennon Mac  Chief Complaint: left hip pain   HPI:   09/13/2022 Patient is a 37 year old male complaining of left hip pain. Patient states that he has been in pain for a while thought it was his bed got a new bed and he still has pain , glute pain " ball and socket pain" constant pain , no radiating pain , no MOI, tylenol for the pain and that doesn't seem to help , no numbness or tingling, standing and walking increase the pain and noting he does to make it feel better   Relevant Historical Information: Transposition of great arteries, sick sinus  syndrome, gilbert syndrome  Additional pertinent review of systems negative.   Current Outpatient Medications:    albuterol (VENTOLIN HFA) 108 (90 Base) MCG/ACT inhaler, Inhale 2 puffs into the lungs every 4 (four) hours as needed for wheezing or shortness of breath., Disp: 8 g, Rfl: 1   cyanocobalamin (VITAMIN B12) 1000 MCG/ML injection, 1000 mcg (1 mg) injection once per week for four weeks, followed by 1000 mcg injection once per month., Disp: 4 mL, Rfl: 3   Fluticasone-Umeclidin-Vilant (TRELEGY ELLIPTA) 200-62.5-25 MCG/ACT AEPB, Inhale 1 puff into the lungs daily., Disp: 28 each, Rfl: 5   gabapentin (NEURONTIN) 300 MG capsule, Take 1 capsule (300 mg total) by mouth at bedtime., Disp: 90 capsule, Rfl: 3   meloxicam (MOBIC) 15 MG tablet, Take 1 tablet (15 mg total) by mouth daily., Disp: 30 tablet, Rfl: 0   montelukast (SINGULAIR) 10 MG tablet, Take 1 tablet (10 mg total) by mouth at bedtime., Disp: 30 tablet, Rfl: 5   Olopatadine-Mometasone (RYALTRIS) 665-25 MCG/ACT SUSP, Place 1-2 sprays into the nose in the morning and at bedtime., Disp: 29 g, Rfl: 5   omeprazole (PRILOSEC) 40 MG capsule, Take 1 capsule (40 mg total) by mouth daily., Disp: 30 capsule, Rfl: 2   sildenafil (REVATIO) 20 MG tablet, Take 1-2 tablets as needed for erectile dysfunction., Disp: 25 tablet, Rfl: 3   Objective:     Vitals:   09/13/22 1358  BP: 118/78  Pulse: [place] Kitchen)  103  SpO2: 97%  Weight: 166 lb (75.3 kg)  Height: '5\' 10"'$  (1.778 m)      Body mass index is 23.82 kg/m.    Physical Exam:    General: awake, alert, and oriented no acute distress, nontoxic Skin: no suspicious lesions or rashes Neuro:sensation intact distally with no dificits, normal muscle tone, no atrophy, strength 5/5 in all tested lower ext groups Psych: normal mood and affect, speech clear  Left hip: No deformity, swelling or wasting ROM Flexion 90, ext 30, IR 45, ER 45 TTP gluteal musculature NTTP over the hip flexors, greater  trochanter, si joint, lumbar spine Negative log roll with FROM Negative FABER Positive FADIR for lateral hip pain Positive piriformis test for pain and stretching, though no radicular symptoms Positive trendelenberg Gait normal    Electronically signed by:  John Myers D.[facility] 2:25 PM 09/13/22

## 2022-09-13 ENCOUNTER — Ambulatory Visit (INDEPENDENT_AMBULATORY_CARE_PROVIDER_SITE_OTHER): Payer: Medicare Other

## 2022-09-13 ENCOUNTER — Ambulatory Visit: Payer: Medicare Other | Admitting: Sports Medicine

## 2022-09-13 VITALS — BP 118/78 | HR 103 | Ht 70.0 in | Wt 166.0 lb

## 2022-09-13 DIAGNOSIS — S76012A Strain of muscle, fascia and tendon of left hip, initial encounter: Secondary | ICD-10-CM

## 2022-09-13 DIAGNOSIS — M25552 Pain in left hip: Secondary | ICD-10-CM

## 2022-09-13 MED ORDER — MELOXICAM 15 MG PO TABS: 15.0000 mg | ORAL_TABLET | Freq: Every day | ORAL | 0 refills | Status: DC

## 2022-09-13 NOTE — Patient Instructions (Signed)
-   Start meloxicam 15 mg daily x2 weeks.  If still having pain after 2 weeks, complete 3rd-week of meloxicam. May use remaining meloxicam as needed once daily for pain control.  Do not to use additional NSAIDs while taking meloxicam.  May use Tylenol [phone] mg 2 to 3 times a day for breakthrough pain. Glute HEP 3-4 week follow up

## 2022-09-17 ENCOUNTER — Encounter: Payer: Self-pay | Admitting: Sports Medicine

## 2022-09-18 ENCOUNTER — Other Ambulatory Visit: Payer: Self-pay | Admitting: Sports Medicine

## 2022-09-18 MED ORDER — CYCLOBENZAPRINE HCL 5 MG PO TABS: 5.0000 mg | ORAL_TABLET | Freq: Every day | ORAL | 0 refills | Status: DC

## 2022-10-10 NOTE — Progress Notes (Deleted)
John Myers D.[place] Phone: [phone]   Assessment and Plan:     There are no diagnoses linked to this encounter.  ***   Pertinent previous records reviewed include ***   Follow Up: ***     Subjective:   I, John Myers, am serving as a Education administrator for John Myers   Chief Complaint: left hip pain    HPI:    09/13/2022 Patient is a 37 year old male complaining of left hip pain. Patient states that he has been in pain for a while thought it was his bed got a new bed and he still has pain , glute pain " ball and socket pain" constant pain , no radiating pain , no MOI, tylenol for the pain and that doesn't seem to help , no numbness or tingling, standing and walking increase the pain and noting he does to make it feel better   10/11/2022 Patient states    Relevant Historical Information: Transposition of great arteries, sick sinus syndrome, gilbert syndrome  Additional pertinent review of systems negative.   Current Outpatient Medications:    albuterol (VENTOLIN HFA) 108 (90 Base) MCG/ACT inhaler, Inhale 2 puffs into the lungs every 4 (four) hours as needed for wheezing or shortness of breath., Disp: 8 g, Rfl: 1   cyanocobalamin (VITAMIN B12) 1000 MCG/ML injection, 1000 mcg (1 mg) injection once per week for four weeks, followed by 1000 mcg injection once per month., Disp: 4 mL, Rfl: 3   cyclobenzaprine (FLEXERIL) 5 MG tablet, Take 1 tablet (5 mg total) by mouth at bedtime., Disp: 30 tablet, Rfl: 0   Fluticasone-Umeclidin-Vilant (TRELEGY ELLIPTA) 200-62.5-25 MCG/ACT AEPB, Inhale 1 puff into the lungs daily., Disp: 28 each, Rfl: 5   gabapentin (NEURONTIN) 300 MG capsule, Take 1 capsule (300 mg total) by mouth at bedtime., Disp: 90 capsule, Rfl: 3   meloxicam (MOBIC) 15 MG tablet, Take 1 tablet (15 mg total) by mouth daily., Disp: 30 tablet, Rfl: 0   montelukast (SINGULAIR) 10 MG tablet, Take  1 tablet (10 mg total) by mouth at bedtime., Disp: 30 tablet, Rfl: 5   Olopatadine-Mometasone (RYALTRIS) 665-25 MCG/ACT SUSP, Place 1-2 sprays into the nose in the morning and at bedtime., Disp: 29 g, Rfl: 5   omeprazole (PRILOSEC) 40 MG capsule, Take 1 capsule (40 mg total) by mouth daily., Disp: 30 capsule, Rfl: 2   sildenafil (REVATIO) 20 MG tablet, Take 1-2 tablets as needed for erectile dysfunction., Disp: 25 tablet, Rfl: 3   Objective:     There were no vitals filed for this visit.    There is no height or weight on file to calculate BMI.    Physical Exam:    ***   Electronically signed by:  John Myers D.[facility] 7:29 AM 10/10/22

## 2022-10-11 ENCOUNTER — Ambulatory Visit: Payer: Medicare Other | Admitting: Sports Medicine

## 2022-10-13 ENCOUNTER — Encounter: Payer: Self-pay | Admitting: Sports Medicine

## 2022-10-15 ENCOUNTER — Encounter: Payer: Self-pay | Admitting: Family Medicine

## 2022-10-21 NOTE — Progress Notes (Unsigned)
    Benito Mccreedy D.[place] Phone: [phone]   Assessment and Plan:     There are no diagnoses linked to this encounter.  ***   Pertinent previous records reviewed include ***   Follow Up: ***     Subjective:   I, Lycia Sachdeva, am serving as a Education administrator for Doctor Glennon Mac   Chief Complaint: left hip pain    HPI:    09/13/2022 Patient is a 37 year old male complaining of left hip pain. Patient states that he has been in pain for a while thought it was his bed got a new bed and he still has pain , glute pain " ball and socket pain" constant pain , no radiating pain , no MOI, tylenol for the pain and that doesn't seem to help , no numbness or tingling, standing and walking increase the pain and noting he does to make it feel better   10/22/2022 Patient states    Relevant Historical Information: Transposition of great arteries, sick sinus syndrome, gilbert syndrome  Additional pertinent review of systems negative.   Current Outpatient Medications:    albuterol (VENTOLIN HFA) 108 (90 Base) MCG/ACT inhaler, Inhale 2 puffs into the lungs every 4 (four) hours as needed for wheezing or shortness of breath., Disp: 8 g, Rfl: 1   cyanocobalamin (VITAMIN B12) 1000 MCG/ML injection, 1000 mcg (1 mg) injection once per week for four weeks, followed by 1000 mcg injection once per month., Disp: 4 mL, Rfl: 3   cyclobenzaprine (FLEXERIL) 5 MG tablet, Take 1 tablet (5 mg total) by mouth at bedtime., Disp: 30 tablet, Rfl: 0   Fluticasone-Umeclidin-Vilant (TRELEGY ELLIPTA) 200-62.5-25 MCG/ACT AEPB, Inhale 1 puff into the lungs daily., Disp: 28 each, Rfl: 5   gabapentin (NEURONTIN) 300 MG capsule, Take 1 capsule (300 mg total) by mouth at bedtime., Disp: 90 capsule, Rfl: 3   meloxicam (MOBIC) 15 MG tablet, Take 1 tablet (15 mg total) by mouth daily., Disp: 30 tablet, Rfl: 0   montelukast (SINGULAIR) 10 MG tablet,  Take 1 tablet (10 mg total) by mouth at bedtime., Disp: 30 tablet, Rfl: 5   Olopatadine-Mometasone (RYALTRIS) 665-25 MCG/ACT SUSP, Place 1-2 sprays into the nose in the morning and at bedtime., Disp: 29 g, Rfl: 5   omeprazole (PRILOSEC) 40 MG capsule, Take 1 capsule (40 mg total) by mouth daily., Disp: 30 capsule, Rfl: 2   sildenafil (REVATIO) 20 MG tablet, Take 1-2 tablets as needed for erectile dysfunction., Disp: 25 tablet, Rfl: 3   Objective:     There were no vitals filed for this visit.    There is no height or weight on file to calculate BMI.    Physical Exam:    ***   Electronically signed by:  Benito Mccreedy D.[facility] 4:17 PM 10/21/22

## 2022-10-22 ENCOUNTER — Ambulatory Visit (INDEPENDENT_AMBULATORY_CARE_PROVIDER_SITE_OTHER): Payer: Medicare Other | Admitting: Sports Medicine

## 2022-10-22 DIAGNOSIS — M25552 Pain in left hip: Secondary | ICD-10-CM

## 2022-10-26 ENCOUNTER — Encounter: Payer: Self-pay | Admitting: Family Medicine

## 2022-10-27 ENCOUNTER — Other Ambulatory Visit: Payer: Self-pay | Admitting: Sports Medicine

## 2022-10-27 ENCOUNTER — Encounter: Payer: Self-pay | Admitting: Family Medicine

## 2022-11-05 ENCOUNTER — Encounter: Payer: Self-pay | Admitting: Family Medicine

## 2022-11-05 ENCOUNTER — Telehealth (INDEPENDENT_AMBULATORY_CARE_PROVIDER_SITE_OTHER): Payer: Medicare Other | Admitting: Family Medicine

## 2022-11-05 VITALS — Ht 70.0 in | Wt 170.0 lb

## 2022-11-05 DIAGNOSIS — M25552 Pain in left hip: Secondary | ICD-10-CM | POA: Diagnosis not present

## 2022-11-05 NOTE — Progress Notes (Unsigned)
    John Myers D.[place] Phone: [phone]   Assessment and Plan:     There are no diagnoses linked to this encounter.  ***   Pertinent previous records reviewed include ***   Follow Up: ***     Subjective:   I, John Myers, am serving as a Education administrator for Doctor Glennon Mac   Chief Complaint: left hip pain    HPI:    09/13/2022 Patient is a 36 year old male complaining of left hip pain. Patient states that he has been in pain for a while thought it was his bed got a new bed and he still has pain , glute pain " ball and socket pain" constant pain , no radiating pain , no MOI, tylenol for the pain and that doesn't seem to help , no numbness or tingling, standing and walking increase the pain and noting he does to make it feel better   11/06/2022 Patient states   Relevant Historical Information: Transposition of great arteries, sick sinus syndrome, gilbert syndrome Additional pertinent review of systems negative.   Current Outpatient Medications:    albuterol (VENTOLIN HFA) 108 (90 Base) MCG/ACT inhaler, Inhale 2 puffs into the lungs every 4 (four) hours as needed for wheezing or shortness of breath., Disp: 8 g, Rfl: 1   cyanocobalamin (VITAMIN B12) 1000 MCG/ML injection, 1000 mcg (1 mg) injection once per week for four weeks, followed by 1000 mcg injection once per month., Disp: 4 mL, Rfl: 3   cyclobenzaprine (FLEXERIL) 5 MG tablet, Take 1 tablet (5 mg total) by mouth at bedtime., Disp: 30 tablet, Rfl: 0   Fluticasone-Umeclidin-Vilant (TRELEGY ELLIPTA) 200-62.5-25 MCG/ACT AEPB, Inhale 1 puff into the lungs daily., Disp: 28 each, Rfl: 5   gabapentin (NEURONTIN) 300 MG capsule, Take 1 capsule (300 mg total) by mouth at bedtime., Disp: 90 capsule, Rfl: 3   meloxicam (MOBIC) 15 MG tablet, Take 1 tablet (15 mg total) by mouth daily., Disp: 30 tablet, Rfl: 0   montelukast (SINGULAIR) 10 MG tablet, Take 1  tablet (10 mg total) by mouth at bedtime., Disp: 30 tablet, Rfl: 5   Olopatadine-Mometasone (RYALTRIS) 665-25 MCG/ACT SUSP, Place 1-2 sprays into the nose in the morning and at bedtime., Disp: 29 g, Rfl: 5   omeprazole (PRILOSEC) 40 MG capsule, Take 1 capsule (40 mg total) by mouth daily., Disp: 30 capsule, Rfl: 2   sildenafil (REVATIO) 20 MG tablet, Take 1-2 tablets as needed for erectile dysfunction., Disp: 25 tablet, Rfl: 3   Objective:     There were no vitals filed for this visit.    There is no height or weight on file to calculate BMI.    Physical Exam:    ***   Electronically signed by:  John Myers D.[facility] 4:39 PM 11/05/22

## 2022-11-05 NOTE — Progress Notes (Signed)
Phone [phone] Virtual visit via Video note   Subjective:  Chief complaint: Chief Complaint  Patient presents with   discuss options    Pt wants to discuss options for hip pain.    This visit type was conducted due to national recommendations for restrictions regarding the COVID-19 Pandemic (e.g. social distancing).  This format is felt to be most appropriate for this patient at this time balancing risks to patient and risks to population by having him in for in person visit.  No physical exam was performed (except for noted visual exam or audio findings with Telehealth visits).    Our team/I connected with [name] at 11:20 AM EST by a video enabled telemedicine application (doxy.me or caregility through epic) and verified that I am speaking with the correct person using two identifiers.  Location patient: [facility] Location provider: [facility], office Persons participating in the virtual visit:  patient  Our team/I discussed the limitations of evaluation and management by telemedicine and the availability of in person appointments. In light of current covid-19 pandemic, patient also understands that we are trying to protect them by minimizing in office contact if at all possible.  The patient expressed consent for telemedicine visit and agreed to proceed. Patient understands insurance will be billed.   Past Medical History-  Patient Active Problem List   Diagnosis Date Noted   Not well controlled asthma without complication [id number]    Priority: High   Sick sinus syndrome ([place]) 03/12/2014    Priority: High   TGA (transposition of great arteries)     Priority: High   Gilbert syndrome 2005    Priority: High   Gastroesophageal reflux disease 03/19/2022    Priority: Medium    Left shoulder pain 06/14/2020    Priority: Medium    BPPV (benign paroxysmal positional vertigo) 03/09/2015    Priority: Low   Other allergic rhinitis 01/22/2013    Priority: Low   Dysphagia  03/19/2022    Medications- reviewed and updated Current Outpatient Medications  Medication Sig Dispense Refill   albuterol (VENTOLIN HFA) 108 (90 Base) MCG/ACT inhaler Inhale 2 puffs into the lungs every 4 (four) hours as needed for wheezing or shortness of breath. 8 g 1   cyanocobalamin (VITAMIN B12) 1000 MCG/ML injection 1000 mcg (1 mg) injection once per week for four weeks, followed by 1000 mcg injection once per month. 4 mL 3   cyclobenzaprine (FLEXERIL) 5 MG tablet Take 1 tablet (5 mg total) by mouth at bedtime. 30 tablet 0   Fluticasone-Umeclidin-Vilant (TRELEGY ELLIPTA) 200-62.5-25 MCG/ACT AEPB Inhale 1 puff into the lungs daily. 28 each 5   gabapentin (NEURONTIN) 300 MG capsule Take 1 capsule (300 mg total) by mouth at bedtime. 90 capsule 3   meloxicam (MOBIC) 15 MG tablet Take 1 tablet (15 mg total) by mouth daily. 30 tablet 0   montelukast (SINGULAIR) 10 MG tablet Take 1 tablet (10 mg total) by mouth at bedtime. 30 tablet 5   Olopatadine-Mometasone (RYALTRIS) 665-25 MCG/ACT SUSP Place 1-2 sprays into the nose in the morning and at bedtime. 29 g 5   omeprazole (PRILOSEC) 40 MG capsule Take 1 capsule (40 mg total) by mouth daily. 30 capsule 2   sildenafil (REVATIO) 20 MG tablet Take 1-2 tablets as needed for erectile dysfunction. 25 tablet 3   No current facility-administered medications for this visit.     Objective:  Ht '5\' 10"'$  (1.778 m)   Wt 170 lb (77.1 kg)   BMI 24.39  kg/m  self reported vitals Gen: NAD, resting comfortably Lungs: nonlabored, normal respiratory rate  Skin: appears dry, no obvious rash     Assessment and Plan   # Left hip pain  S:saw Dr. Glennon Mac for Left hip pain on 09/13/22 and also noted to have strain of glute- he gave a course of meloxicam and started home exercise program (declined PT due to prior experience). X -ray without fracture or dislocation   Plan was "3 to 4 weeks for reevaluation. Could consider gluteal muscle ultrasound and CSI if no  improvement or worsening of symptoms "    He reports ongoing hip pain despite doing home exercise program and ran out of meloxicam (plus didn't help much). He prefers to avoid injection as did not have much benefit with his shoulder in the past. Cannot have MRI  due to implanted hardware/pacemaker. Had follow up on 10/01/2012 but Dr. Glennon Mac unable to connect with patient.  A/P: ongoing left hip pain- patient connected with me to discuss options. He had considered getting another opinion from an orthopedist but after discussion of options he opted to return to see Dr. Glennon Mac in person to discuss options- I support his decision. He wanted to hold off on meloxicam refill or other meds at thsit ime due to minimal benefit   Recommended follow up:  Future Appointments  Date Time Provider [place]  11/06/2022  1:00 PM Glennon Mac, DO [facility] None    Lab/Order associations:   ICD-10-CM   1. Left hip pain  M25.552       Return precautions advised.  Garret Reddish, MD

## 2022-11-06 ENCOUNTER — Ambulatory Visit: Payer: Medicare Other | Admitting: Sports Medicine

## 2022-11-06 ENCOUNTER — Ambulatory Visit: Payer: Self-pay

## 2022-11-06 VITALS — BP 140/80 | HR 76 | Ht 70.0 in | Wt 172.0 lb

## 2022-11-06 DIAGNOSIS — M25552 Pain in left hip: Secondary | ICD-10-CM | POA: Diagnosis not present

## 2022-11-06 DIAGNOSIS — S76012D Strain of muscle, fascia and tendon of left hip, subsequent encounter: Secondary | ICD-10-CM | POA: Diagnosis not present

## 2022-11-06 NOTE — Patient Instructions (Addendum)
Good to see you CT scan left hip with contrast May use TENS units  Tennis ball as a massager, Voltaren gel as needed for pain relief  Follow up 3 days after to discuss results

## 2022-11-08 ENCOUNTER — Other Ambulatory Visit: Payer: Self-pay | Admitting: Sports Medicine

## 2022-11-08 DIAGNOSIS — M25552 Pain in left hip: Secondary | ICD-10-CM

## 2022-11-08 DIAGNOSIS — S76012A Strain of muscle, fascia and tendon of left hip, initial encounter: Secondary | ICD-10-CM

## 2022-11-08 DIAGNOSIS — S76012D Strain of muscle, fascia and tendon of left hip, subsequent encounter: Secondary | ICD-10-CM

## 2022-11-08 NOTE — Addendum Note (Signed)
Addended by: Pollyann Glen on: 11/08/2022 08:23 AM   Modules accepted: Orders

## 2022-11-22 ENCOUNTER — Ambulatory Visit
Admission: RE | Admit: 2022-11-22 | Discharge: 2022-11-22 | Disposition: A | Discharge status: Home / Self Care | Payer: Medicare Other | Source: Ambulatory Visit | Attending: Sports Medicine | Admitting: Sports Medicine

## 2022-11-22 DIAGNOSIS — S76012A Strain of muscle, fascia and tendon of left hip, initial encounter: Secondary | ICD-10-CM

## 2022-11-22 DIAGNOSIS — M25552 Pain in left hip: Secondary | ICD-10-CM | POA: Diagnosis not present

## 2022-11-22 DIAGNOSIS — S76012D Strain of muscle, fascia and tendon of left hip, subsequent encounter: Secondary | ICD-10-CM

## 2022-11-22 MED ORDER — IOPAMIDOL (ISOVUE-M 200) INJECTION 41%
15.0000 mL | Freq: Once | INTRAMUSCULAR | Status: AC
  Administered 2022-11-22: 15 mL via INTRA_ARTICULAR

## 2022-11-26 DIAGNOSIS — K080 Exfoliation of teeth due to systemic causes: Secondary | ICD-10-CM | POA: Diagnosis not present

## 2022-12-23 DIAGNOSIS — K080 Exfoliation of teeth due to systemic causes: Secondary | ICD-10-CM | POA: Diagnosis not present

## 2023-03-12 DIAGNOSIS — I495 Sick sinus syndrome: Secondary | ICD-10-CM | POA: Diagnosis not present

## 2023-03-12 DIAGNOSIS — Z950 Presence of cardiac pacemaker: Secondary | ICD-10-CM | POA: Diagnosis not present

## 2023-03-12 DIAGNOSIS — R002 Palpitations: Secondary | ICD-10-CM | POA: Diagnosis not present

## 2023-03-14 DIAGNOSIS — I499 Cardiac arrhythmia, unspecified: Secondary | ICD-10-CM | POA: Diagnosis not present

## 2023-03-14 DIAGNOSIS — I4510 Unspecified right bundle-branch block: Secondary | ICD-10-CM | POA: Diagnosis not present

## 2023-03-14 DIAGNOSIS — I517 Cardiomegaly: Secondary | ICD-10-CM | POA: Diagnosis not present

## 2023-03-24 DIAGNOSIS — R002 Palpitations: Secondary | ICD-10-CM | POA: Diagnosis not present

## 2023-03-24 DIAGNOSIS — I495 Sick sinus syndrome: Secondary | ICD-10-CM | POA: Diagnosis not present

## 2023-03-25 ENCOUNTER — Encounter: Payer: Self-pay | Admitting: Family Medicine

## 2023-07-23 DIAGNOSIS — Z950 Presence of cardiac pacemaker: Secondary | ICD-10-CM | POA: Diagnosis not present

## 2023-07-23 DIAGNOSIS — I495 Sick sinus syndrome: Secondary | ICD-10-CM | POA: Diagnosis not present

## 2023-07-23 DIAGNOSIS — I4510 Unspecified right bundle-branch block: Secondary | ICD-10-CM | POA: Diagnosis not present

## 2023-07-30 DIAGNOSIS — I495 Sick sinus syndrome: Secondary | ICD-10-CM | POA: Diagnosis not present

## 2023-07-30 DIAGNOSIS — Z45010 Encounter for checking and testing of cardiac pacemaker pulse generator [battery]: Secondary | ICD-10-CM | POA: Diagnosis not present

## 2023-11-10 ENCOUNTER — Other Ambulatory Visit: Payer: Self-pay

## 2023-11-10 ENCOUNTER — Emergency Department (HOSPITAL_BASED_OUTPATIENT_CLINIC_OR_DEPARTMENT_OTHER)

## 2023-11-10 ENCOUNTER — Emergency Department (HOSPITAL_BASED_OUTPATIENT_CLINIC_OR_DEPARTMENT_OTHER): Admitting: Radiology

## 2023-11-10 ENCOUNTER — Encounter (HOSPITAL_BASED_OUTPATIENT_CLINIC_OR_DEPARTMENT_OTHER): Payer: Self-pay | Admitting: Emergency Medicine

## 2023-11-10 ENCOUNTER — Emergency Department (HOSPITAL_BASED_OUTPATIENT_CLINIC_OR_DEPARTMENT_OTHER)
Admission: EM | Admit: 2023-11-10 | Discharge: 2023-11-11 | Disposition: A | Discharge status: Home / Self Care | Attending: Emergency Medicine | Admitting: Emergency Medicine

## 2023-11-10 DIAGNOSIS — S0990XA Unspecified injury of head, initial encounter: Secondary | ICD-10-CM | POA: Insufficient documentation

## 2023-11-10 DIAGNOSIS — Z043 Encounter for examination and observation following other accident: Secondary | ICD-10-CM | POA: Diagnosis not present

## 2023-11-10 DIAGNOSIS — W19XXXA Unspecified fall, initial encounter: Secondary | ICD-10-CM | POA: Insufficient documentation

## 2023-11-10 DIAGNOSIS — M25551 Pain in right hip: Secondary | ICD-10-CM | POA: Diagnosis not present

## 2023-11-10 DIAGNOSIS — S7001XA Contusion of right hip, initial encounter: Secondary | ICD-10-CM | POA: Insufficient documentation

## 2023-11-10 DIAGNOSIS — W228XXA Striking against or struck by other objects, initial encounter: Secondary | ICD-10-CM | POA: Diagnosis not present

## 2023-11-10 DIAGNOSIS — R519 Headache, unspecified: Secondary | ICD-10-CM | POA: Diagnosis not present

## 2023-11-10 DIAGNOSIS — R930 Abnormal findings on diagnostic imaging of skull and head, not elsewhere classified: Secondary | ICD-10-CM | POA: Diagnosis not present

## 2023-11-10 MED ORDER — ACETAMINOPHEN 325 MG PO TABS
650.0000 mg | ORAL_TABLET | Freq: Four times a day (QID) | ORAL | Status: DC | PRN
  Administered 2023-11-10: 650 mg via ORAL
  Filled 2023-11-10: qty 2

## 2023-11-10 MED ORDER — DIPHENHYDRAMINE HCL 50 MG/ML IJ SOLN
25.0000 mg | Freq: Once | INTRAMUSCULAR | Status: AC
  Administered 2023-11-10: 25 mg via INTRAMUSCULAR
  Filled 2023-11-10: qty 1

## 2023-11-10 MED ORDER — METOCLOPRAMIDE HCL 5 MG/ML IJ SOLN
10.0000 mg | Freq: Once | INTRAMUSCULAR | Status: AC
  Administered 2023-11-10: 10 mg via INTRAMUSCULAR
  Filled 2023-11-10: qty 2

## 2023-11-10 MED ORDER — KETOROLAC TROMETHAMINE 30 MG/ML IJ SOLN
30.0000 mg | Freq: Once | INTRAMUSCULAR | Status: AC
  Administered 2023-11-10: 30 mg via INTRAMUSCULAR
  Filled 2023-11-10: qty 1

## 2023-11-10 NOTE — ED Provider Notes (Signed)
 [place] EMERGENCY DEPARTMENT AT [facility] Provider Note   CSN: [id number] Arrival date & time: 11/10/23  1637     History {Add pertinent medical, surgical, social history, OB history to HPI:1} Chief Complaint  Patient presents with   John Myers is a 38 y.o. male.  Patient with a history of transposition of the great arteries status post surgery, pacemaker in place presents after fall.  He was working in a empty pool when he slid down approximately a 5 foot embankment after losing his balance and falling on his right side.  Landed on his right hip and struck his head.  No loss of consciousness.  Complains of posterior headache right hip pain.  Also some paraspinal right neck pain.  No loss of consciousness.  No vomiting.  No visual changes.  No focal weakness, numbness or tingling.  No chest pain or shortness of breath.  Able to ambulate his hip without difficulty. No blood thinner use. Some lightheadedness and dizziness.  The history is provided by the patient.  Fall Associated symptoms include headaches. Pertinent negatives include no chest pain, no abdominal pain and no shortness of breath.       Home Medications Prior to Admission medications   Medication Sig Start Date End Date Taking? Authorizing Provider  albuterol (VENTOLIN HFA) 108 (90 Base) MCG/ACT inhaler Inhale 2 puffs into the lungs every 4 (four) hours as needed for wheezing or shortness of breath. 04/30/22   Ellamae Sia, DO  cyanocobalamin (VITAMIN B12) 1000 MCG/ML injection 1000 mcg (1 mg) injection once per week for four weeks, followed by 1000 mcg injection once per month. 08/20/22   Shelva Majestic, MD  cyclobenzaprine (FLEXERIL) 5 MG tablet Take 1 tablet (5 mg total) by mouth at bedtime. 09/18/22   Richardean Sale, DO  Fluticasone-Umeclidin-Vilant (TRELEGY ELLIPTA) 200-62.5-25 MCG/ACT AEPB Inhale 1 puff into the lungs daily. 04/30/22   Ellamae Sia, DO  gabapentin (NEURONTIN) 300 MG  capsule Take 1 capsule (300 mg total) by mouth at bedtime. 06/14/20   Shelva Majestic, MD  meloxicam (MOBIC) 15 MG tablet Take 1 tablet (15 mg total) by mouth daily. 09/13/22   Richardean Sale, DO  montelukast (SINGULAIR) 10 MG tablet Take 1 tablet (10 mg total) by mouth at bedtime. 08/15/22   Shelva Majestic, MD  Olopatadine-Mometasone [facility]) [phone] MCG/ACT SUSP Place 1-2 sprays into the nose in the morning and at bedtime. 03/19/22   Ellamae Sia, DO  omeprazole (PRILOSEC) 40 MG capsule Take 1 capsule (40 mg total) by mouth daily. 03/31/22   Ellamae Sia, DO  sildenafil (REVATIO) 20 MG tablet Take 1-2 tablets as needed for erectile dysfunction. 06/17/19   Shelva Majestic, MD      Allergies    Patient has no known allergies.    Review of Systems   Review of Systems  Constitutional:  Positive for fatigue. Negative for activity change, appetite change and fever.  HENT:  Negative for congestion and rhinorrhea.   Eyes:  Negative for visual disturbance.  Respiratory:  Negative for cough, chest tightness and shortness of breath.   Cardiovascular:  Negative for chest pain.  Gastrointestinal:  Positive for nausea. Negative for abdominal pain and vomiting.  Genitourinary:  Negative for dysuria and hematuria.  Musculoskeletal:  Negative for arthralgias and myalgias.  Skin:  Negative for rash.  Neurological:  Positive for dizziness, light-headedness and headaches. Negative for weakness.   all other systems are negative  except as noted in the HPI and PMH.    Physical Exam Updated Vital Signs BP (!) 140/89   Pulse (!) 59   Temp 97.8 F (36.6 C) (Oral)   Resp 14   Wt 74.8 kg   SpO2 100%   BMI 23.68 kg/m  Physical Exam Vitals and nursing note reviewed.  Constitutional:      General: He is not in acute distress.    Appearance: He is well-developed. He is not ill-appearing.  HENT:     Head: Normocephalic and atraumatic.     Mouth/Throat:     Pharynx: No oropharyngeal exudate.  Eyes:      Conjunctiva/sclera: Conjunctivae normal.     Pupils: Pupils are equal, round, and reactive to light.  Neck:     Comments: No midline C-spine tenderness, paraspinal right neck tenderness Cardiovascular:     Rate and Rhythm: Normal rate and regular rhythm.     Heart sounds: Normal heart sounds. No murmur heard. Pulmonary:     Effort: Pulmonary effort is normal. No respiratory distress.     Breath sounds: Normal breath sounds.  Abdominal:     Palpations: Abdomen is soft.     Tenderness: There is no abdominal tenderness. There is no guarding or rebound.  Musculoskeletal:        General: Tenderness present. Normal range of motion.     Cervical back: Normal range of motion and neck supple.     Comments: Contusion right hip with painful range of motion.  No shortening external rotation.  Full range of motion of right hip.  Full range of motion of right elbow with slight abrasion.  Skin:    General: Skin is warm.  Neurological:     Mental Status: He is alert and oriented to person, place, and time.     Cranial Nerves: No cranial nerve deficit.     Motor: No abnormal muscle tone.     Coordination: Coordination normal.     Comments:  5/5 strength throughout. CN 2-12 intact.Equal grip strength.   Psychiatric:        Behavior: Behavior normal.     ED Results / Procedures / Treatments   Labs (all labs ordered are listed, but only abnormal results are displayed) Labs Reviewed - No data to display  EKG None  Radiology CT Cervical Spine Wo Contrast Result Date: 11/10/2023 CLINICAL DATA:  Status post fall. EXAM: CT CERVICAL SPINE WITHOUT CONTRAST TECHNIQUE: Multidetector CT imaging of the cervical spine was performed without intravenous contrast. Multiplanar CT image reconstructions were also generated. RADIATION DOSE REDUCTION: This exam was performed according to the departmental dose-optimization program which includes automated exposure control, adjustment of the mA and/or kV according  to patient size and/or use of iterative reconstruction technique. COMPARISON:  November 16, 2019 FINDINGS: Alignment: Normal. Skull base and vertebrae: No acute fracture. No primary bone lesion or focal pathologic process. Soft tissues and spinal canal: No prevertebral fluid or swelling. No visible canal hematoma. Disc levels: Normal multilevel endplates are seen with normal multilevel intervertebral disc spaces. Normal, bilateral multilevel facet joints are noted. Upper chest: Negative. Other: The right and left lobes of the thyroid gland are heterogeneous in appearance IMPRESSION: No acute fracture or subluxation in the cervical spine. Electronically Signed   By: Aram Candela M.D.   On: 11/10/2023 21:56   CT Head Wo Contrast Result Date: 11/10/2023 CLINICAL DATA:  Status post fall. EXAM: CT HEAD WITHOUT CONTRAST TECHNIQUE: Contiguous axial images were obtained from the  base of the skull through the vertex without intravenous contrast. RADIATION DOSE REDUCTION: This exam was performed according to the departmental dose-optimization program which includes automated exposure control, adjustment of the mA and/or kV according to patient size and/or use of iterative reconstruction technique. COMPARISON:  July 29, 2017 FINDINGS: Brain: No evidence of acute infarction, hemorrhage, hydrocephalus, extra-axial collection or mass lesion/mass effect. Vascular: No hyperdense vessel or unexpected calcification. Skull: Normal. Negative for fracture or focal lesion. Sinuses/Orbits: No acute finding. Other: A stable 5.2 mm x 7.4 mm x 6.4 mm focus of low attenuation (approximately 12.23 Hounsfield units) is seen within the right transverse sinus. IMPRESSION: 1. No acute intracranial abnormality. 2. Stable low-attenuation focus within the right transverse sinus, which may represent a small arachnoid cyst. MRI correlation is recommended. Electronically Signed   By: Aram Candela M.D.   On: 11/10/2023 21:53   DG Hip Unilat  W or Wo Pelvis 2-3 Views Right Result Date: 11/10/2023 CLINICAL DATA:  Status post fall into pool with right hip pain. EXAM: DG HIP (WITH OR WITHOUT PELVIS) 2-3V RIGHT COMPARISON:  None Available. FINDINGS: No acute fracture of the pelvis or right hip. No hip dislocation. The femoral head is well seated. Pubic rami are intact. There is no pubic symphyseal or sacroiliac diastasis. Unremarkable soft tissues. IMPRESSION: Negative radiographs of the pelvis and right hip. Electronically Signed   By: Narda Rutherford M.D.   On: 11/10/2023 21:41    Procedures Procedures  {Document cardiac monitor, telemetry assessment procedure when appropriate:1}  Medications Ordered in ED Medications  acetaminophen (TYLENOL) tablet 650 mg (650 mg Oral Given 11/10/23 1803)  ketorolac (TORADOL) 30 MG/ML injection 30 mg (has no administration in time range)  metoCLOPramide (REGLAN) injection 10 mg (has no administration in time range)  diphenhydrAMINE (BENADRYL) injection 25 mg (has no administration in time range)    ED Course/ Medical Decision Making/ A&P   {   Click here for ABCD2, HEART and other calculatorsREFRESH Note before signing :1}                              Medical Decision Making Amount and/or Complexity of Data Reviewed Labs: ordered. Decision-making details documented in ED Course. Radiology: ordered and independent interpretation performed. Decision-making details documented in ED Course. ECG/medicine tests: ordered and independent interpretation performed. Decision-making details documented in ED Course.  Risk Prescription drug management.   Fall with head injury and right hip injury.  No loss of consciousness.  No vomiting.  Neurologically intact.  CT scan obtained in triage is negative for acute traumatic injury.  Patient aware of arachnoid cyst and has been told about this in the past.  Cannot have MRI due to his pacemaker.  Hip x-ray is negative.  He is a tolerate p.o. and  ambulate.  Suspect likely closed head injury secondary to mechanical fall.  Will treat symptoms.  Follow-up with neurology for further evaluation of his arachnoid cyst.  Patient declines elbow x-ray.  He has full range of motion of elbow without bony tenderness.  Discussed that occult fracture cannot be ruled out without x-ray.  He appears to have capacity to make this decision.  Headache is improved with Toradol, Reglan and Benadryl.  Suspect likely closed head injury secondary to falling.  He is aware of arachnoid cyst findings and need for PCP and neurology follow-up.  Will give referral.  {Document critical care time when appropriate:1} {Document review of labs  and clinical decision tools ie heart score, Chads2Vasc2 etc:1}  {Document your independent review of radiology images, and any outside records:1} {Document your discussion with family members, caretakers, and with consultants:1} {Document social determinants of health affecting pt's care:1} {Document your decision making why or why not admission, treatments were needed:1} Final Clinical Impression(s) / ED Diagnoses Final diagnoses:  None    Rx / DC Orders ED Discharge Orders     None

## 2023-11-10 NOTE — ED Triage Notes (Signed)
 Slip fall while working in a pool Hit head Dizziness Neck pain No loc

## 2023-11-10 NOTE — ED Provider Triage Note (Signed)
 Emergency Medicine Provider Triage Evaluation Note  John Myers , a 38 y.o. male  was evaluated in triage.  Pt complains of fall. Was in pool and fell/slid down 5 feet into pool. Denies falling 5 feet. Here complaining of headache, neck pain, right hip pain. Denies LOC, visual disturbance, nausea, vomiting. Denies blood thinners.  Review of Systems  Positive:  Negative:   Physical Exam  BP (!) 151/99 (BP Location: Right Arm)   Pulse 67   Temp 98.3 F (36.8 C) (Oral)   Resp 18   Wt 74.8 kg   SpO2 99%   BMI 23.68 kg/m  Gen:   Awake, no distress   Resp:  Normal effort  MSK:   Moves extremities without difficulty  Other:  No focal deficits. Appropriate ROM to R hip  Medical Decision Making  Medically screening exam initiated at 6:00 PM.  Appropriate orders placed.  John Myers was informed that the remainder of the evaluation will be completed by another provider, this initial triage assessment does not replace that evaluation, and the importance of remaining in the ED until their evaluation is complete.     Al Decant, PA-C 11/10/23 1800

## 2023-11-11 NOTE — ED Notes (Signed)
 Pt eloped. Pt left without paperwork

## 2023-11-11 NOTE — ED Notes (Signed)
Pt left prior to discharge paperwork. 

## 2023-11-11 NOTE — Discharge Instructions (Signed)
 Follow-up with your primary doctor as well as the neurologist.  Return to the ED with new or worsening symptoms

## 2023-11-13 ENCOUNTER — Ambulatory Visit: Admitting: Neurology

## 2023-11-13 ENCOUNTER — Encounter: Payer: Self-pay | Admitting: Neurology

## 2023-11-13 VITALS — BP 128/85 | HR 77 | Ht 72.0 in | Wt 169.0 lb

## 2023-11-13 DIAGNOSIS — G43709 Chronic migraine without aura, not intractable, without status migrainosus: Secondary | ICD-10-CM | POA: Diagnosis not present

## 2023-11-13 DIAGNOSIS — G930 Cerebral cysts: Secondary | ICD-10-CM

## 2023-11-13 MED ORDER — TOPIRAMATE 50 MG PO TABS: 50.0000 mg | ORAL_TABLET | Freq: Every evening | ORAL | 3 refills | Status: DC

## 2023-11-13 NOTE — Progress Notes (Signed)
 [facility]  PATIENT: John Myers DOB: [birth year]  REQUESTING CLINICIAN: Glynn Octave, MD HISTORY FROM: Patient  REASON FOR VISIT: Headaches    HISTORICAL  CHIEF COMPLAINT:  Chief Complaint  Patient presents with   New Patient (Initial Visit)    Pt in 16,  Pt is here for ED referral for closed head injury and headaches. Pt states he has been feeling dizzy. Pt would also like to discuss about the cyst in his brain and wether or not it is causing his migraines. Pt states that he gets 2-3 migraines per week that last all day.     HISTORY OF PRESENT ILLNESS:  This is a 38 year old gentleman past medical history of transposition of the great vessel, migraines, asthma who is referred to neurologist following a fall and headache.  Patient tells me that he is here basically for migraine headaches.  He has been suffering from migraine for almost all of his life but lately the frequency has increased.  Currently he is having 2-3 headaches day per week.  Headaches described as pressure and bandlike pain, 8 out of 10.  They can last the entire day, sometimes will go to sleep and wake up with a headache.  With the headaches, he does have photophobia but denies any phonophobia, nausea or vomiting.  He does report a family history of migraine in his mother.  He has tried over-the-counter medication Tylenol, ibuprofen without relief.  He has never been prescribed any medication for headaches. Following his fall on March 3, he did have a head CT which showed a small arachnoid cyst in the right transverse sinus, and patient wondering if this is causing his headaches.    Headache History and Characteristics: Onset:  Location: Bandlike  Quality:  Pressure  Intensity: 8/10.  Duration: Last the entire day, can wake up with headaches  Migrainous Features: Photophobia.  Aura: No  History of brain injury or tumor: No  Family history: Mother Motion sickness: no Cardiac  history: no  OTC: tylenol, Ibuprofen Caffeine: Sleep:  Mood/ Stress:   Prior prophylaxis: Propranolol: No  Verapamil:No TCA: No Topamax: No Depakote: No Effexor: No Cymbalta: No Neurontin:No  Prior abortives: Triptan: No Anti-emetic: No Steroids: No Ergotamine suppository: No    OTHER MEDICAL CONDITIONS: Migraines, Seizures, Asthma    REVIEW OF SYSTEMS: Full 14 system review of systems performed and negative with exception of: As noted in the HPI   ALLERGIES: No Known Allergies  HOME MEDICATIONS: Outpatient Medications Prior to Visit  Medication Sig Dispense Refill   albuterol (VENTOLIN HFA) 108 (90 Base) MCG/ACT inhaler Inhale 2 puffs into the lungs every 4 (four) hours as needed for wheezing or shortness of breath. 8 g 1   cyanocobalamin (VITAMIN B12) 1000 MCG/ML injection 1000 mcg (1 mg) injection once per week for four weeks, followed by 1000 mcg injection once per month. (Patient not taking: Reported on 11/13/2023) 4 mL 3   cyclobenzaprine (FLEXERIL) 5 MG tablet Take 1 tablet (5 mg total) by mouth at bedtime. (Patient not taking: Reported on 11/13/2023) 30 tablet 0   Fluticasone-Umeclidin-Vilant (TRELEGY ELLIPTA) 200-62.5-25 MCG/ACT AEPB Inhale 1 puff into the lungs daily. (Patient not taking: Reported on 11/13/2023) 28 each 5   gabapentin (NEURONTIN) 300 MG capsule Take 1 capsule (300 mg total) by mouth at bedtime. (Patient not taking: Reported on 11/13/2023) 90 capsule 3   meloxicam (MOBIC) 15 MG tablet Take 1 tablet (15 mg total) by mouth daily. (Patient not taking: Reported on  11/13/2023) 30 tablet 0   montelukast (SINGULAIR) 10 MG tablet Take 1 tablet (10 mg total) by mouth at bedtime. (Patient not taking: Reported on 11/13/2023) 30 tablet 5   Olopatadine-Mometasone (RYALTRIS) 665-25 MCG/ACT SUSP Place 1-2 sprays into the nose in the morning and at bedtime. (Patient not taking: Reported on 11/13/2023) 29 g 5   omeprazole (PRILOSEC) 40 MG capsule Take 1 capsule (40 mg total) by  mouth daily. (Patient not taking: Reported on 11/13/2023) 30 capsule 2   sildenafil (REVATIO) 20 MG tablet Take 1-2 tablets as needed for erectile dysfunction. (Patient not taking: Reported on 11/13/2023) 25 tablet 3   No facility-administered medications prior to visit.    PAST MEDICAL HISTORY: Past Medical History:  Diagnosis Date   Allergic rhinitis    Anginal pain (HCC)    Asthma    Cancer (HCC)    Depression    never treated with meds    Dyspnea    Dysrhythmia    PVC'S   Finger injury    Gilbert syndrome 2005   Premature ventricular contractions    Presence of permanent cardiac pacemaker    Right ventricular dysfunction    Sick sinus syndrome (HCC) 2014   s/p PPM   TGA (transposition of great arteries)    surgical correction at [facility] at 73 months of age    PAST SURGICAL HISTORY: Past Surgical History:  Procedure Laterality Date   CLOSED REDUCTION METACARPAL WITH PERCUTANEOUS PINNING Right 07/03/2016   Procedure: RIGHT HAND CLOSED REDUCTION AND PINNING;  Surgeon: Bradly Bienenstock, MD;  Location: [facility] OR;  Service: Orthopedics;  Laterality: Right;  AXILLARY BLOCK WITH SEDATION   PACEMAKER INSERTION  03/08/13   MDT Adapta SR (atrial) PPM implanted by Dr Mary Sella at [facility] for sick sinus syndrome   SHOULDER ARTHROSCOPY WITH CAPSULORRHAPHY Left 03/26/2016   Procedure: LEFT SHOULDER ATHROSCOPY WITH DEBRIDEMENT, chromoplasty, and bursectomy.;  Surgeon: Eugenia Mcalpine, MD;  Location: [facility] ORS;  Service: Orthopedics;  Laterality: Left;  ANESTHESIA: GENERAL/INTERSCALENE BLOCK   SUBACROMIAL DECOMPRESSION Left 03/26/2016   Procedure: LEFT SUBACROMIAL DECOMPRESSION;  Surgeon: Eugenia Mcalpine, MD;  Location: [facility] ORS;  Service: Orthopedics;  Laterality: Left;   TRANSPOSITION OF GREAT VESSELS REPAIR     Senning procedure performed at 23 months of age at [facility].   WISDOM TOOTH EXTRACTION      FAMILY HISTORY: Family History  Problem Relation Age of Onset   Colon cancer Mother    Healthy Father    Healthy  Sister    Healthy Brother    Heart attack Paternal Grandfather        in late 53s or 81s   Hyperlipidemia Paternal Grandfather    Hypertension Paternal Grandfather    Breast cancer Maternal Grandmother     SOCIAL HISTORY: Social History   Socioeconomic History   Marital status: Single    Spouse name: Not on file   Number of children: Not on file   Years of education: Not on file   Highest education level: Not on file  Occupational History   Not on file  Tobacco Use   Smoking status: Never   Smokeless tobacco: Never  Vaping Use   Vaping status: Never Used  Substance and Sexual Activity   Alcohol use: No   Drug use: No   Sexual activity: Yes    Birth control/protection: Condom    Comment: condom if not in long term relationship  Other Topics Concern   Not on file  Social History Narrative  Single [place]- Lives with dog- pit/terrier mix - 5.109 years old 2023       November 2023 disability- transposition of great arteries and chronic left shoulder pain after pacemaker placement   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 Wt Readings from Last 3 Encounters:  11/13/23 169 lb (76.7 kg)  11/10/23 165 lb (74.8 kg)  11/06/22 172 lb (78 kg)   Patient is in no distress; well developed, nourished and groomed; neck is supple  MUSCULOSKELETAL: Gait, strength, tone, movements noted in Neurologic exam below  NEUROLOGIC: MENTAL STATUS:       No data to display         awake, alert, oriented to person, place and time recent and remote memory intact normal attention and concentration language fluent, comprehension intact, naming intact fund of knowledge appropriate  CRANIAL NERVE:  2nd, 3rd, 4th, 6th - pupils equal and reactive to light, visual fields full to confrontation, extraocular muscles intact, no nystagmus 5th - facial sensation symmetric 7th - facial strength symmetric 8th - hearing intact 9th - palate elevates symmetrically, uvula midline 11th - shoulder shrug symmetric 12th - tongue protrusion midline  MOTOR:  normal bulk and tone, full strength in the BUE, BLE  SENSORY:  normal and symmetric to light touch  COORDINATION:  finger-nose-finger, fine finger movements normal  GAIT/STATION:  normal   DIAGNOSTIC DATA (LABS, IMAGING, TESTING) - I reviewed patient records, labs, notes, testing and imaging myself where available.  Lab Results  Component Value Date   WBC 7.8 08/15/2022   HGB 15.5 08/15/2022   HCT 45.6 08/15/2022   MCV 88.9 08/15/2022   PLT 173.0 08/15/2022      Component Value Date/Time   NA 141 08/15/2022 1508   K 3.8 08/15/2022 1508   CL 104 08/15/2022 1508   CO2 28 08/15/2022 1508   GLUCOSE 99 08/15/2022 1508   BUN 12 08/15/2022 1508   CREATININE 0.86 08/15/2022 1508   CALCIUM 9.6 08/15/2022 1508   PROT 7.4 08/15/2022 1508   ALBUMIN 5.0 08/15/2022 1508   AST 17 08/15/2022 1508   ALT 22 08/15/2022 1508   ALKPHOS 72 08/15/2022 1508   BILITOT 1.6 (H) 08/15/2022 1508   GFRNONAA >60 07/29/2017 1751   GFRAA >60 07/29/2017 1751   Lab Results  Component Value Date   CHOL 119 06/08/2019   HDL 37.40 (L) 06/08/2019   LDLCALC 63 06/08/2019   TRIG 93.0 06/08/2019   CHOLHDL 3 06/08/2019   No results found for: "HGBA1C" Lab Results  Component Value Date   VITAMINB12 186 (L) 08/15/2022   No results found for: "TSH"  Head CT 11/10/2023 1. No acute intracranial  abnormality. 2. Stable low-attenuation focus within the right transverse sinus, which may represent a small arachnoid cyst. MRI correlation is recommended.    ASSESSMENT AND PLAN  38 y.o. year old male with history of  headaches, asthma who is presenting for management of his headaches and arachnoid cyst found on CT scan.  In terms of the headaches, based on description, they are likely migraine, will start the patient on topiramate as preventative and patient can continue to use Aleve as abortive medication.  Advised him to contact me if his headaches are not improving.  For his arachnoid cyst, informed patient that it is benign, the size is small even though it seems increased from his previous head CT in 2018 (my comparison).  Plan will be to repeat another head CT in 2 years.  Patient does have a defibrillator and cannot get an MRI.   1. Chronic migraine without aura without status migrainosus, not intractable   2. Arachnoid cyst     Patient Instructions  Start Topiramate 50 mg, 1/2 tablet nightly for one week then increase to full tablet  Consider Aleve for severe headaches  Return in 6 months for follow up or sooner if worse  For the arachnoid cyst, will plan to repeat CT scan in 2 years. (Patient unable to get a MRI)    No orders of the defined types were placed in this encounter.   Meds ordered this encounter  Medications   topiramate (TOPAMAX) 50 MG tablet    Sig: Take 1 tablet (50 mg total) by mouth at bedtime.    Dispense:  90 tablet    Refill:  3    Return in about 6 months (around 05/15/2024).    Windell Norfolk, MD 11/13/2023, 3:50 PM  [facility] [address] [place], [place] [address] [phone]

## 2023-11-13 NOTE — Patient Instructions (Signed)
 Start Topiramate 50 mg, 1/2 tablet nightly for one week then increase to full tablet  Consider Aleve for severe headaches  Return in 6 months for follow up or sooner if worse  For the arachnoid cyst, will plan to repeat CT scan in 2 years. (Patient unable to get a MRI)

## 2023-11-20 ENCOUNTER — Other Ambulatory Visit: Payer: Self-pay

## 2023-11-20 ENCOUNTER — Telehealth: Payer: Self-pay

## 2023-11-20 ENCOUNTER — Encounter: Payer: Self-pay | Admitting: Family Medicine

## 2023-11-20 MED ORDER — ALBUTEROL SULFATE HFA 108 (90 BASE) MCG/ACT IN AERS: 2.0000 | INHALATION_SPRAY | RESPIRATORY_TRACT | 0 refills | Status: DC | PRN

## 2023-11-20 NOTE — Telephone Encounter (Signed)
 Rx sent to updated pharmacy.  Copied from CRM [id number]. Topic: Clinical - Prescription Issue >> Nov 20, 2023  2:57 PM [place] J wrote: Reason for CRM: Albuterol sent to incorrect pharmacy. Medication needs to be sent [facility]: [facility] [address] [place] [address]

## 2023-12-09 ENCOUNTER — Telehealth (INDEPENDENT_AMBULATORY_CARE_PROVIDER_SITE_OTHER): Admitting: Family Medicine

## 2023-12-09 ENCOUNTER — Encounter: Payer: Self-pay | Admitting: Family Medicine

## 2023-12-09 VITALS — Wt 170.0 lb

## 2023-12-09 DIAGNOSIS — I495 Sick sinus syndrome: Secondary | ICD-10-CM

## 2023-12-09 DIAGNOSIS — E538 Deficiency of other specified B group vitamins: Secondary | ICD-10-CM | POA: Insufficient documentation

## 2023-12-09 DIAGNOSIS — J4540 Moderate persistent asthma, uncomplicated: Secondary | ICD-10-CM

## 2023-12-09 DIAGNOSIS — J45909 Unspecified asthma, uncomplicated: Secondary | ICD-10-CM | POA: Insufficient documentation

## 2023-12-09 MED ORDER — ALBUTEROL SULFATE HFA 108 (90 BASE) MCG/ACT IN AERS: 2.0000 | INHALATION_SPRAY | RESPIRATORY_TRACT | 5 refills | Status: AC | PRN

## 2023-12-09 MED ORDER — BUDESONIDE-FORMOTEROL FUMARATE 160-4.5 MCG/ACT IN AERO: 2.0000 | INHALATION_SPRAY | Freq: Two times a day (BID) | RESPIRATORY_TRACT | 3 refills | Status: AC

## 2023-12-09 NOTE — Progress Notes (Signed)
 Phone [phone] Virtual visit via Video note   Subjective:  Chief complaint: Chief Complaint  Patient presents with   Medication Management    Pt requesting refill for Symbicort     Our team/I connected with Ola Spurr at  3:20 PM EDT by a video enabled telemedicine application (caregility through epic) and verified that I am speaking with the correct person using two identifiers. Our team/I discussed the limitations of evaluation and management by telemedicine and the availability of in person appointments.No physical exam was performed (except for noted visual exam or audio findings with Telehealth visits).   Location patient: Home[place] Location provider: [facility], office Persons participating in the virtual visit:  patient  Past Medical History-  Patient Active Problem List   Diagnosis Date Noted   Not well controlled asthma without complication 06/14/2020    Priority: High   Sick sinus syndrome (HCC) 03/12/2014    Priority: High   TGA (transposition of great arteries)     Priority: High   Gilbert syndrome 2005    Priority: High   Asthma     Priority: Medium    Gastroesophageal reflux disease 03/19/2022    Priority: Medium    Left shoulder pain 06/14/2020    Priority: Medium    B12 deficiency 12/09/2023    Priority: Low   BPPV (benign paroxysmal positional vertigo) 03/09/2015    Priority: Low   Other allergic rhinitis 01/22/2013    Priority: Low   Dysphagia 03/19/2022    Medications- reviewed and updated Current Outpatient Medications  Medication Sig Dispense Refill   topiramate (TOPAMAX) 50 MG tablet Take 1 tablet (50 mg total) by mouth at bedtime. 90 tablet 3   albuterol (VENTOLIN HFA) 108 (90 Base) MCG/ACT inhaler Inhale 2 puffs into the lungs every 4 (four) hours as needed for wheezing or shortness of breath. 18 g 5   budesonide-formoterol (SYMBICORT) 160-4.5 MCG/ACT inhaler Inhale 2 puffs into the lungs in the morning and at bedtime. 4 each 3   No  current facility-administered medications for this visit.     Objective:  Wt 170 lb (77.1 kg)   BMI 23.06 kg/m  self reported vitals Gen: NAD, resting comfortably Lungs: nonlabored, normal respiratory rate  Skin: appears dry, no obvious rash     Assessment and Plan   # Sick sinus syndrome-patient with pacemaker in place in June 2014 - Will likely need pacemaker replacement in 2025 or so   # Transposition of great arteries-follows with cardiology but has not seen recently-encouraged him to schedule follow-up   # Migraines without aura- follow up with Dr. Teresa Coombs S: Dr. Teresa Coombs diagnosed him with migraine headaches and started him on Topamax 50 mg daily - Aleve recommended for severe headaches - Also has small arachnoid cyst with plan for repeat CT scan every 2 years as patient unable to get MRI with pacemaker A/P: Patient seems to be having some improvement-his main concern was to make sure that arachnoid cyst was not the cause of his headaches and he was reassured by neurology visit  New pacemaker in next year.   # Asthma-previously seen by Dr. Coral Spikes: Maintenance Medication: Symbicort 160-4.5 mcg 2 puffs twice daily As needed medication: Albuterol. Patient is using this sparingly per week-may do before mowing the yard.  A/P: Asthma has been largely well-controlled as long as consistent with Symbicort especially in spring season-he requests for me to refill this since he has been stable-I am willing to do so and also provided  refill on his albuterol  # B12 deficiency S: Current treatment/medication (oral vs. IM):  B12 daily recommended- he at least takes a few times a week Lab Results  Component Value Date   VITAMINB12 186 (L) 08/15/2022   A/P: hopefully improved- encouraged physical in 6-12 months and rechecking bloodwork in 12 months to recheck- he agrees    2. Stable low-attenuation focus within the right transverse sinus, which may represent a small arachnoid cyst. MRI  correlation is recommended.   Recommended follow up: Recommended 6 to 80-month physical Future Appointments  Date Time Provider Department Center  05/24/2024  2:45 PM Windell Norfolk, MD [facility] None    Lab/Order associations:   ICD-10-CM   1. Moderate persistent asthma without complication  J45.40     2. B12 deficiency  E53.8     3. Sick sinus syndrome (HCC) Chronic I49.5       Meds ordered this encounter  Medications   budesonide-formoterol (SYMBICORT) 160-4.5 MCG/ACT inhaler    Sig: Inhale 2 puffs into the lungs in the morning and at bedtime.    Dispense:  4 each    Refill:  3   albuterol (VENTOLIN HFA) 108 (90 Base) MCG/ACT inhaler    Sig: Inhale 2 puffs into the lungs every 4 (four) hours as needed for wheezing or shortness of breath.    Dispense:  18 g    Refill:  5   Return precautions advised.  Tana Conch, MD

## 2023-12-09 NOTE — Assessment & Plan Note (Signed)
#   B12 deficiency S: Current treatment/medication (oral vs. IM):  B12 daily recommended- he at least takes a few times a week Lab Results  Component Value Date   VITAMINB12 186 (L) 08/15/2022   A/P: hopefully improved- encouraged physical in 6-12 months and rechecking bloodwork in 12 months to recheck- he agrees

## 2023-12-09 NOTE — Assessment & Plan Note (Signed)
#   Asthma-previously seen by Dr. Coral Spikes: Maintenance Medication: Symbicort 160-4.5 mcg 2 puffs twice daily As needed medication: Albuterol. Patient is using this sparingly per week-may do before mowing the yard.  A/P: Asthma has been largely well-controlled as long as consistent with Symbicort especially in spring season-he requests for me to refill this since he has been stable-I am willing to do so and also provided refill on his albuterol

## 2024-01-20 DIAGNOSIS — I495 Sick sinus syndrome: Secondary | ICD-10-CM | POA: Diagnosis not present

## 2024-01-20 DIAGNOSIS — R55 Syncope and collapse: Secondary | ICD-10-CM | POA: Diagnosis not present

## 2024-01-20 DIAGNOSIS — Z950 Presence of cardiac pacemaker: Secondary | ICD-10-CM | POA: Diagnosis not present

## 2024-01-21 ENCOUNTER — Ambulatory Visit: Admitting: Neurology

## 2024-01-27 ENCOUNTER — Ambulatory Visit

## 2024-02-03 DIAGNOSIS — Z45018 Encounter for adjustment and management of other part of cardiac pacemaker: Secondary | ICD-10-CM | POA: Diagnosis not present

## 2024-02-24 DIAGNOSIS — I495 Sick sinus syndrome: Secondary | ICD-10-CM | POA: Diagnosis not present

## 2024-02-24 DIAGNOSIS — Z7951 Long term (current) use of inhaled steroids: Secondary | ICD-10-CM | POA: Diagnosis not present

## 2024-02-24 DIAGNOSIS — Z45010 Encounter for checking and testing of cardiac pacemaker pulse generator [battery]: Secondary | ICD-10-CM | POA: Diagnosis not present

## 2024-03-09 DIAGNOSIS — I495 Sick sinus syndrome: Secondary | ICD-10-CM | POA: Diagnosis not present

## 2024-03-16 DIAGNOSIS — Z950 Presence of cardiac pacemaker: Secondary | ICD-10-CM | POA: Diagnosis not present

## 2024-03-16 DIAGNOSIS — Z8774 Personal history of (corrected) congenital malformations of heart and circulatory system: Secondary | ICD-10-CM | POA: Diagnosis not present

## 2024-03-16 DIAGNOSIS — Q243 Pulmonary infundibular stenosis: Secondary | ICD-10-CM | POA: Diagnosis not present

## 2024-03-16 DIAGNOSIS — I495 Sick sinus syndrome: Secondary | ICD-10-CM | POA: Diagnosis not present

## 2024-03-16 DIAGNOSIS — I340 Nonrheumatic mitral (valve) insufficiency: Secondary | ICD-10-CM | POA: Diagnosis not present

## 2024-03-16 DIAGNOSIS — Z9889 Other specified postprocedural states: Secondary | ICD-10-CM | POA: Diagnosis not present

## 2024-03-16 DIAGNOSIS — I071 Rheumatic tricuspid insufficiency: Secondary | ICD-10-CM | POA: Diagnosis not present

## 2024-03-16 DIAGNOSIS — I081 Rheumatic disorders of both mitral and tricuspid valves: Secondary | ICD-10-CM | POA: Diagnosis not present

## 2024-03-16 DIAGNOSIS — Z79899 Other long term (current) drug therapy: Secondary | ICD-10-CM | POA: Diagnosis not present

## 2024-03-16 DIAGNOSIS — Q203 Discordant ventriculoarterial connection: Secondary | ICD-10-CM | POA: Diagnosis not present

## 2024-04-09 DIAGNOSIS — I495 Sick sinus syndrome: Secondary | ICD-10-CM | POA: Diagnosis not present

## 2024-04-09 DIAGNOSIS — R55 Syncope and collapse: Secondary | ICD-10-CM | POA: Diagnosis not present

## 2024-04-14 ENCOUNTER — Ambulatory Visit (INDEPENDENT_AMBULATORY_CARE_PROVIDER_SITE_OTHER)

## 2024-04-14 VITALS — Ht 72.0 in | Wt 170.0 lb

## 2024-04-14 DIAGNOSIS — Z0000 Encounter for general adult medical examination without abnormal findings: Secondary | ICD-10-CM

## 2024-04-14 NOTE — Progress Notes (Signed)
 Subjective:   John [place] is a 38 y.o. who presents for a Medicare Wellness preventive visit.  As a reminder, Annual Wellness Visits don't include a physical exam, and some assessments may be limited, especially if this visit is performed virtually. We may recommend an in-person follow-up visit with your provider if needed.  Visit Complete: Virtual I connected with  [name] on 04/14/24 by a audio enabled telemedicine application and verified that I am speaking with the correct person using two identifiers.  Patient Location: Home  Provider Location: Home Office  I discussed the limitations of evaluation and management by telemedicine. The patient expressed understanding and agreed to proceed.  Vital Signs: Because this visit was a virtual/telehealth visit, some criteria may be missing or patient reported. Any vitals not documented were not able to be obtained and vitals that have been documented are patient reported.  VideoDeclined- This patient declined Librarian, academic. Therefore the visit was completed with audio only.  Persons Participating in Visit: Patient.  AWV Questionnaire: No: Patient Medicare AWV questionnaire was not completed prior to this visit.  Cardiac Risk Factors include: male gender     Objective:    Today's Vitals   04/14/24 1544  Weight: 170 lb (77.1 kg)  Height: 6' (1.829 m)   Body mass index is 23.06 kg/m.     04/14/2024    3:47 PM 11/10/2023   11:55 PM 11/10/2023   11:54 PM 11/10/2023    5:59 PM 03/15/2020    2:54 PM 07/29/2017    5:51 PM 03/18/2017    7:56 AM  Advanced Directives  Does Patient Have a Medical Advance Directive? No No No No No No  No   Would patient like information on creating a medical advance directive? No - Patient declined No - Patient declined No - Patient declined No - Patient declined  No - Patient declined       Data saved with a previous flowsheet row definition    Current  Medications (verified) Outpatient Encounter Medications as of 04/14/2024  Medication Sig   albuterol  (VENTOLIN  HFA) 108 (90 Base) MCG/ACT inhaler Inhale 2 puffs into the lungs every 4 (four) hours as needed for wheezing or shortness of breath.   budesonide -formoterol  (SYMBICORT ) 160-4.5 MCG/ACT inhaler Inhale 2 puffs into the lungs in the morning and at bedtime.   topiramate  (TOPAMAX ) 50 MG tablet Take 1 tablet (50 mg total) by mouth at bedtime.   No facility-administered encounter medications on file as of 04/14/2024.    Allergies (verified) Patient has no known allergies.   History: Past Medical History:  Diagnosis Date   Allergic rhinitis    Anginal pain (HCC)    Asthma    Cancer (HCC)    Depression    never treated with meds    Dyspnea    Dysrhythmia    PVC'S   Finger injury    Gilbert syndrome 2005   Premature ventricular contractions    Presence of permanent cardiac pacemaker    Right ventricular dysfunction    Sick sinus syndrome (HCC) 2014   s/p PPM   TGA (transposition of great arteries)    surgical correction at [facility] at 46 months of age   Past Surgical History:  Procedure Laterality Date   CLOSED REDUCTION METACARPAL WITH PERCUTANEOUS PINNING Right 07/03/2016   Procedure: RIGHT HAND CLOSED REDUCTION AND PINNING;  Surgeon: Prentice Pagan, MD;  Location: [facility] OR;  Service: Orthopedics;  Laterality: Right;  AXILLARY BLOCK  WITH SEDATION   PACEMAKER INSERTION  03/08/13   MDT Adapta SR (atrial) PPM implanted by Dr Lunda at [facility] for sick sinus syndrome   SHOULDER ARTHROSCOPY WITH CAPSULORRHAPHY Left 03/26/2016   Procedure: LEFT SHOULDER ATHROSCOPY WITH DEBRIDEMENT, chromoplasty, and bursectomy.;  Surgeon: Lamar Collet, MD;  Location: [facility] ORS;  Service: Orthopedics;  Laterality: Left;  ANESTHESIA: GENERAL/INTERSCALENE BLOCK   SUBACROMIAL DECOMPRESSION Left 03/26/2016   Procedure: LEFT SUBACROMIAL DECOMPRESSION;  Surgeon: Lamar Collet, MD;  Location: [facility] ORS;  Service:  Orthopedics;  Laterality: Left;   TRANSPOSITION OF GREAT VESSELS REPAIR     Senning procedure performed at 65 months of age at [facility].   WISDOM TOOTH EXTRACTION     Family History  Problem Relation Age of Onset   Colon cancer Mother    Healthy Father    Healthy Sister    Healthy Brother    Heart attack Paternal Grandfather        in late 40s or 25s   Hyperlipidemia Paternal Grandfather    Hypertension Paternal Grandfather    Breast cancer Maternal Grandmother    Social History   Socioeconomic History   Marital status: Single    Spouse name: Not on file   Number of children: Not on file   Years of education: Not on file   Highest education level: Not on file  Occupational History   Not on file  Tobacco Use   Smoking status: Never   Smokeless tobacco: Never  Vaping Use   Vaping status: Never Used  Substance and Sexual Activity   Alcohol use: No   Drug use: No   Sexual activity: Yes    Birth control/protection: Condom    Comment: condom if not in long term relationship  Other Topics Concern   Not on file  Social History Narrative   Single [place]- Lives with dog- pit/terrier mix - 47.75 years old 2023       November 2023 disability- transposition of great arteries and chronic left shoulder pain after pacemaker placement   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, international aid      Hobbies: hunt, fish, golf, outdoors, Pharmacist, community      Social Drivers of Health   Financial Resource Strain: Medium Risk (04/14/2024)   Overall Financial Resource Strain (CARDIA)    Difficulty of Paying Living Expenses: Somewhat hard  Food Insecurity: Food Insecurity Present (04/14/2024)   Hunger Vital Sign    Worried About Running Out of Food in the Last Year: Often true    Ran Out of Food in the Last Year: Often true  Transportation Needs: No Transportation Needs (04/14/2024)   PRAPARE - Administrator, Civil Service (Medical): No    Lack of Transportation  (Non-Medical): No  Physical Activity: Sufficiently Active (04/14/2024)   Exercise Vital Sign    Days of Exercise per Week: 7 days    Minutes of Exercise per Session: 60 min  Stress: No Stress Concern Present (04/14/2024)   [employer] of Occupational Health - Occupational Stress Questionnaire    Feeling of Stress: Not at all  Social Connections: Socially Isolated (04/14/2024)   Social Connection and Isolation Panel    Frequency of Communication with Friends and Family: More than three times a week    Frequency of Social Gatherings with Friends and Family: More than three times a week    Attends Religious Services: Never    Database administrator or Organizations: No    Attends Club or  Organization Meetings: Never    Marital Status: Never married    Tobacco Counseling Counseling given: Not Answered    Clinical Intake:  Pre-visit preparation completed: Yes  Pain : No/denies pain     BMI - recorded: 23.06 Nutritional Status: BMI of 19-24  Normal Nutritional Risks: None Diabetes: No  No results found for: HGBA1C   How often do you need to have someone help you when you read instructions, pamphlets, or other written materials from your doctor or pharmacy?: 1 - Never  Interpreter Needed?: No  Information entered by :: Ellouise Haws, LPN   Activities of Daily Living     04/14/2024    3:46 PM  In your present state of health, do you have any difficulty performing the following activities:  Hearing? 0  Vision? 0  Difficulty concentrating or making decisions? 0  Walking or climbing stairs? 0  Dressing or bathing? 0  Doing errands, shopping? 0  Preparing Food and eating ? N  Using the Toilet? N  In the past six months, have you accidently leaked urine? N  Do you have problems with loss of bowel control? N  Managing your Medications? N  Managing your Finances? N  Housekeeping or managing your Housekeeping? N    Patient Care Team: Katrinka Garnette KIDD, MD as PCP -  General (Family Medicine)  I have updated your Care Teams any recent Medical Services you may have received from other providers in the past year.     Assessment:   This is a routine wellness examination for [place].  Hearing/Vision screen Hearing Screening - Comments:: Pt denies any hearing issues  Vision Screening - Comments:: Encouraged to follow up with eye provider    Goals Addressed             This Visit's Progress    Patient Stated       Maintain health and activity        Depression Screen     04/14/2024    3:47 PM 08/15/2022    2:06 PM 02/07/2022    2:21 PM 06/03/2019   10:37 AM  PHQ 2/9 Scores  PHQ - 2 Score 0 0 0 0  PHQ- 9 Score  0 3 6    Fall Risk     04/14/2024    3:49 PM 08/15/2022    2:06 PM  Fall Risk   Falls in the past year? 1 1  Number falls in past yr: 1 0  Injury with Fall? 1 1  Comment head and hip   Risk for fall due to : No Fall Risks;History of fall(s) History of fall(s)  Follow up Falls prevention discussed Falls evaluation completed      Data saved with a previous flowsheet row definition    MEDICARE RISK AT HOME:  Medicare Risk at Home Any stairs in or around the home?: Yes If so, are there any without handrails?: No Home free of loose throw rugs in walkways, pet beds, electrical cords, etc?: Yes Adequate lighting in your home to reduce risk of falls?: Yes Life alert?: No Use of a cane, walker or w/c?: No Grab bars in the bathroom?: No Shower chair or bench in shower?: No Elevated toilet seat or a handicapped toilet?: No  TIMED UP AND GO:  Was the test performed?  No  Cognitive Function: 6CIT completed        04/14/2024    3:51 PM  6CIT Screen  What Year? 0 points  What month?  0 points  What time? 0 points  Count back from 20 0 points  Months in reverse 4 points  Repeat phrase 0 points  Total Score 4 points    Immunizations Immunization History  Administered Date(s) Administered   Pneumococcal Polysaccharide-23  07/10/2001   Td 10/10/2002   Tdap 07/08/2013, 08/15/2020    Screening Tests Health Maintenance  Topic Date Due   COVID-19 Vaccine (1) Never done   Pneumococcal Vaccine: 19-49 Years (2 of 2 - PCV) 07/10/2002   Hepatitis C Screening  Never done   Hepatitis B Vaccines (1 of 3 - 19+ 3-dose series) Never done   HPV VACCINES (1 - 3-dose SCDM series) Never done   INFLUENZA VACCINE  04/09/2024   Medicare Annual Wellness (AWV)  04/14/2025   DTaP/Tdap/Td (4 - Td or Tdap) 08/15/2030   HIV Screening  Completed   Meningococcal B Vaccine  Aged Out    Health Maintenance  Health Maintenance Due  Topic Date Due   COVID-19 Vaccine (1) Never done   Pneumococcal Vaccine: 19-49 Years (2 of 2 - PCV) 07/10/2002   Hepatitis C Screening  Never done   Hepatitis B Vaccines (1 of 3 - 19+ 3-dose series) Never done   HPV VACCINES (1 - 3-dose SCDM series) Never done   INFLUENZA VACCINE  04/09/2024   Health Maintenance Items Addressed: See Nurse Notes at the end of this note  Additional Screening:  Vision Screening: Recommended annual ophthalmology exams for early detection of glaucoma and other disorders of the eye. Would you like a referral to an eye doctor? No    Dental Screening: Recommended annual dental exams for proper oral hygiene  Community Resource Referral / Chronic Care Management: CRR required this visit?  No   CCM required this visit?  No   Plan:    I have personally reviewed and noted the following in the patient's chart:   Medical and social history Use of alcohol, tobacco or illicit drugs  Current medications and supplements including opioid prescriptions. Patient is not currently taking opioid prescriptions. Functional ability and status Nutritional status Physical activity Advanced directives List of other physicians Hospitalizations, surgeries, and ER visits in previous 12 months Vitals Screenings to include cognitive, depression, and falls Referrals and  appointments  In addition, I have reviewed and discussed with patient certain preventive protocols, quality metrics, and best practice recommendations. A written personalized care plan for preventive services as well as general preventive health recommendations were provided to patient.   Ellouise VEAR Haws, LPN   09/11/7972   After Visit Summary: (MyChart) Due to this being a telephonic visit, the after visit summary with patients personalized plan was offered to patient via MyChart   Notes: Nothing significant to report at this time.

## 2024-04-14 NOTE — Patient Instructions (Signed)
 Mr. Westrup , Thank you for taking time out of your busy schedule to complete your Annual Wellness Visit with me. I enjoyed our conversation and look forward to speaking with you again next year. I, as well as your care team,  appreciate your ongoing commitment to your health goals. Please review the following plan we discussed and let me know if I can assist you in the future. Your Game plan/ To Do List    Referrals: If you haven't heard from the office you've been referred to, please reach out to them at the phone provided.   Follow up Visits: We will see or speak with you next year for your Next Medicare AWV with our clinical staff Have you seen your provider in the last 6 months (3 months if uncontrolled diabetes)? Yes  Clinician Recommendations:  Aim for 30 minutes of exercise or brisk walking, 6-8 glasses of water, and 5 servings of fruits and vegetables each day.        This is a list of the screenings recommended for you:  Health Maintenance  Topic Date Due   Medicare Annual Wellness Visit  Never done   COVID-19 Vaccine (1) Never done   Pneumococcal Vaccine for high risk medical condition (2 of 2 - PCV) 07/10/2002   Hepatitis C Screening  Never done   Hepatitis B Vaccine (1 of 3 - 19+ 3-dose series) Never done   HPV Vaccine (1 - 3-dose SCDM series) Never done   Flu Shot  04/09/2024   DTaP/Tdap/Td vaccine (4 - Td or Tdap) 08/15/2030   HIV Screening  Completed   Meningitis B Vaccine  Aged Out    Advanced directives: (Declined) Advance directive discussed with you today. Even though you declined this today, please call our office should you change your mind, and we can give you the proper paperwork for you to fill out. Advance Care Planning is important because it:  [x]  Makes sure you receive the medical care that is consistent with your values, goals, and preferences  [x]  It provides guidance to your family and loved ones and reduces their decisional burden about whether or not  they are making the right decisions based on your wishes.  Follow the link provided in your after visit summary or read over the paperwork we have mailed to you to help you started getting your Advance Directives in place. If you need assistance in completing these, please reach out to us  so that we can help you!  See attachments for Preventive Care and Fall Prevention Tips.

## 2024-05-24 ENCOUNTER — Ambulatory Visit: Admitting: Neurology

## 2024-05-24 ENCOUNTER — Encounter: Payer: Self-pay | Admitting: Neurology

## 2024-05-24 VITALS — BP 122/88 | HR 74 | Ht 72.0 in | Wt 168.0 lb

## 2024-05-24 DIAGNOSIS — G43709 Chronic migraine without aura, not intractable, without status migrainosus: Secondary | ICD-10-CM | POA: Diagnosis not present

## 2024-05-24 DIAGNOSIS — G930 Cerebral cysts: Secondary | ICD-10-CM | POA: Diagnosis not present

## 2024-05-24 MED ORDER — TOPIRAMATE 100 MG PO TABS: 100.0000 mg | ORAL_TABLET | Freq: Every evening | ORAL | 3 refills | Status: AC

## 2024-05-24 NOTE — Progress Notes (Signed)
 [facility]  PATIENT: John Myers DOB: 1986-03-05  REQUESTING CLINICIAN: Katrinka Garnette KIDD, MD HISTORY FROM: Patient  REASON FOR VISIT: Headaches    HISTORICAL  CHIEF COMPLAINT:  Chief Complaint  Patient presents with   RM 12    Patient is here alone for migraine follow up - has a migraine today with some pressure ( all day today)     INTERVAL HISTORY 05/24/2024 Patient presents today for follow-up, last visit was in March.  At that time we tried him on topiramate  50 mg nightly.  He tells me the medication helped when he takes it.  Sometimes will forget to take it, and will have a breakthrough headache.  Currently he is taking 100 mg of topiramate .  Would like to increase the dose to 100 mg.  For abortive medication he does take Excedrin.  Due to his cardiac history patient cannot be on a triptan.  On average she is having 1 headache per week but low intensity.   HISTORY OF PRESENT ILLNESS:  This is a 38 year old gentleman past medical history of transposition of the great vessel, migraines, asthma who is referred to neurologist following a fall and headache.  Patient tells me that he is here basically for migraine headaches.  He has been suffering from migraine for almost all of his life but lately the frequency has increased.  Currently he is having 2-3 headaches day per week.  Headaches described as pressure and bandlike pain, 8 out of 10.  They can last the entire day, sometimes will go to sleep and wake up with a headache.  With the headaches, he does have photophobia but denies any phonophobia, nausea or vomiting.  He does report a family history of migraine in his mother.  He has tried over-the-counter medication Tylenol , ibuprofen without relief.  He has never been prescribed any medication for headaches. Following his fall on March 3, he did have a head CT which showed a small arachnoid cyst in the right transverse sinus, and patient wondering if this is  causing his headaches.    Headache History and Characteristics: Onset:  Location: Bandlike  Quality:  Pressure  Intensity: 8/10.  Duration: Last the entire day, can wake up with headaches  Migrainous Features: Photophobia.  Aura: No  History of brain injury or tumor: No  Family history: Mother Motion sickness: no Cardiac history: no  OTC: tylenol , Ibuprofen Caffeine: Sleep:  Mood/ Stress:   Prior prophylaxis: Propranolol: No  Verapamil:No TCA: No Topamax : No Depakote: No Effexor: No Cymbalta: No Neurontin :No  Prior abortives: Triptan: No Anti-emetic: No Steroids: No Ergotamine suppository: No    OTHER MEDICAL CONDITIONS: Migraines, Seizures, Asthma    REVIEW OF SYSTEMS: Full 14 system review of systems performed and negative with exception of: As noted in the HPI   ALLERGIES: No Known Allergies  HOME MEDICATIONS: Outpatient Medications Prior to Visit  Medication Sig Dispense Refill   albuterol  (VENTOLIN  HFA) 108 (90 Base) MCG/ACT inhaler Inhale 2 puffs into the lungs every 4 (four) hours as needed for wheezing or shortness of breath. 18 g 5   topiramate  (TOPAMAX ) 50 MG tablet Take 1 tablet (50 mg total) by mouth at bedtime. 90 tablet 3   budesonide -formoterol  (SYMBICORT ) 160-4.5 MCG/ACT inhaler Inhale 2 puffs into the lungs in the morning and at bedtime. (Patient not taking: Reported on 05/24/2024) 4 each 3   No facility-administered medications prior to visit.    PAST MEDICAL HISTORY: Past Medical History:  Diagnosis Date  Allergic rhinitis    Anginal pain (HCC)    Asthma    Cancer (HCC)    Depression    never treated with meds    Dyspnea    Dysrhythmia    PVC'S   Finger injury    Gilbert syndrome 2005   Premature ventricular contractions    Presence of permanent cardiac pacemaker    Right ventricular dysfunction    Sick sinus syndrome (HCC) 2014   s/p PPM   TGA (transposition of great arteries)    surgical correction at [facility] at 3 months  of age    PAST SURGICAL HISTORY: Past Surgical History:  Procedure Laterality Date   CLOSED REDUCTION METACARPAL WITH PERCUTANEOUS PINNING Right 07/03/2016   Procedure: RIGHT HAND CLOSED REDUCTION AND PINNING;  Surgeon: Prentice Pagan, MD;  Location: [facility] OR;  Service: Orthopedics;  Laterality: Right;  AXILLARY BLOCK WITH SEDATION   PACEMAKER INSERTION  03/08/13   MDT Adapta SR (atrial) PPM implanted by Dr Lunda at [facility] for sick sinus syndrome   SHOULDER ARTHROSCOPY WITH CAPSULORRHAPHY Left 03/26/2016   Procedure: LEFT SHOULDER ATHROSCOPY WITH DEBRIDEMENT, chromoplasty, and bursectomy.;  Surgeon: Lamar Collet, MD;  Location: [facility];  Service: Orthopedics;  Laterality: Left;  ANESTHESIA: GENERAL/INTERSCALENE BLOCK   SUBACROMIAL DECOMPRESSION Left 03/26/2016   Procedure: LEFT SUBACROMIAL DECOMPRESSION;  Surgeon: Lamar Collet, MD;  Location: [facility];  Service: Orthopedics;  Laterality: Left;   TRANSPOSITION OF GREAT VESSELS REPAIR     Senning procedure performed at 54 months of age at [facility]   WISDOM TOOTH EXTRACTION      FAMILY HISTORY: Family History  Problem Relation Age of Onset   Colon cancer Mother    Healthy Father    Healthy Sister    Healthy Brother    Heart attack Paternal Grandfather        in late 43s or 51s   Hyperlipidemia Paternal Grandfather    Hypertension Paternal Grandfather    Breast cancer Maternal Grandmother     SOCIAL HISTORY: Social History   Socioeconomic History   Marital status: Single    Spouse name: Not on file   Number of children: Not on file   Years of education: Not on file   Highest education level: Not on file  Occupational History   Not on file  Tobacco Use   Smoking status: Never   Smokeless tobacco: Never  Vaping Use   Vaping status: Never Used  Substance and Sexual Activity   Alcohol use: No   Drug use: No   Sexual activity: Yes    Birth control/protection: Condom    Comment: condom if not in long term relationship  Other Topics  Concern   Not on file  Social History Narrative   Single [place]- Lives with dog- pit/terrier mix - 66.12 years old 2023       November 2023 disability- transposition of great arteries and chronic left shoulder pain after pacemaker placement   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, international aid      Hobbies: hunt, fish, golf, outdoors, Water engineer only on weekends 1/2 a cup to one cup       Social Drivers of Health   Financial Resource Strain: Medium Risk (04/14/2024)   Overall Financial Resource Strain (CARDIA)    Difficulty of Paying Living Expenses: Somewhat hard  Food Insecurity: Food Insecurity Present (04/14/2024)   Hunger Vital Sign    Worried About Running Out of Food in the  Last Year: Often true    Ran Out of Food in the Last Year: Often true  Transportation Needs: No Transportation Needs (04/14/2024)   PRAPARE - Administrator, Civil Service (Medical): No    Lack of Transportation (Non-Medical): No  Physical Activity: Sufficiently Active (04/14/2024)   Exercise Vital Sign    Days of Exercise per Week: 7 days    Minutes of Exercise per Session: 60 min  Stress: No Stress Concern Present (04/14/2024)   [employer] of Occupational Health - Occupational Stress Questionnaire    Feeling of Stress: Not at all  Social Connections: Socially Isolated (04/14/2024)   Social Connection and Isolation Panel    Frequency of Communication with Friends and Family: More than three times a week    Frequency of Social Gatherings with Friends and Family: More than three times a week    Attends Religious Services: Never    Database administrator or Organizations: No    Attends Banker Meetings: Never    Marital Status: Never married  Intimate Partner Violence: Not At Risk (04/14/2024)   Humiliation, Afraid, Rape, and Kick questionnaire    Fear of Current or Ex-Partner: No    Emotionally Abused: No    Physically Abused: No    Sexually  Abused: No    PHYSICAL EXAM  GENERAL EXAM/CONSTITUTIONAL: Vitals:  Vitals:   05/24/24 1448  BP: 122/88  Pulse: 74  SpO2: 94%  Weight: 168 lb (76.2 kg)  Height: 6' (1.829 m)   Body mass index is 22.78 kg/m. Wt Readings from Last 3 Encounters:  05/24/24 168 lb (76.2 kg)  04/14/24 170 lb (77.1 kg)  12/09/23 170 lb (77.1 kg)   Patient is in no distress; well developed, nourished and groomed; neck is supple  MUSCULOSKELETAL: Gait, strength, tone, movements noted in Neurologic exam below  NEUROLOGIC: MENTAL STATUS:      No data to display         awake, alert, oriented to person, place and time recent and remote memory intact normal attention and concentration language fluent, comprehension intact, naming intact fund of knowledge appropriate  CRANIAL NERVE:  2nd, 3rd, 4th, 6th - pupils equal and reactive to light, visual fields full to confrontation, extraocular muscles intact, no nystagmus 5th - facial sensation symmetric 7th - facial strength symmetric 8th - hearing intact 9th - palate elevates symmetrically, uvula midline 11th - shoulder shrug symmetric 12th - tongue protrusion midline  MOTOR:  normal bulk and tone, full strength in the BUE, BLE  SENSORY:  normal and symmetric to light touch  COORDINATION:  finger-nose-finger, fine finger movements normal  GAIT/STATION:  normal   DIAGNOSTIC DATA (LABS, IMAGING, TESTING) - I reviewed patient records, labs, notes, testing and imaging myself where available.  Lab Results  Component Value Date   WBC 7.8 08/15/2022   HGB 15.5 08/15/2022   HCT 45.6 08/15/2022   MCV 88.9 08/15/2022   PLT 173.0 08/15/2022      Component Value Date/Time   NA 141 08/15/2022 1508   K 3.8 08/15/2022 1508   CL 104 08/15/2022 1508   CO2 28 08/15/2022 1508   GLUCOSE 99 08/15/2022 1508   BUN 12 08/15/2022 1508   CREATININE 0.86 08/15/2022 1508   CALCIUM 9.6 08/15/2022 1508   PROT 7.4 08/15/2022 1508   ALBUMIN  5.0  08/15/2022 1508   AST 17 08/15/2022 1508   ALT 22 08/15/2022 1508   ALKPHOS 72 08/15/2022 1508   BILITOT 1.6 (  H) 08/15/2022 1508   GFRNONAA >60 07/29/2017 1751   GFRAA >60 07/29/2017 1751   Lab Results  Component Value Date   CHOL 119 06/08/2019   HDL 37.40 (L) 06/08/2019   LDLCALC 63 06/08/2019   TRIG 93.0 06/08/2019   CHOLHDL 3 06/08/2019   No results found for: HGBA1C Lab Results  Component Value Date   VITAMINB12 186 (L) 08/15/2022   No results found for: TSH  Head CT 11/10/2023 1. No acute intracranial abnormality. 2. Stable low-attenuation focus within the right transverse sinus, which may represent a small arachnoid cyst. MRI correlation is recommended.    ASSESSMENT AND PLAN  38 y.o. year old male with history of migraines headaches, asthma who is presenting for follow up for his migraine headaches and arachnoid cyst found on CT scan.  In terms of the headaches, I will increase topiramate  to 100 mg nightly and give patient a sample of Nurtec and Ubrelvy.  He will tell me which medication works the best.  Again due to his cardiac history, transposition of the great vessels, I would not want to start him on a triptan. In terms of his arachnoid cyst, we will obtain a head CT.  If stable, we will likely repeat an 2 to 3 years.  I will see him in a year for follow-up but I will contact him after completion of this head CT.   1. Chronic migraine without aura without status migrainosus, not intractable   2. Arachnoid cyst      Patient Instructions  Increase topiramate  of 100 to 100 mg nightly, refill given Will give patient samples of Nurtec and Qulipta.   He will contact us  for updates Will repeat head CT Follow-up in 1 year or sooner if worse.   Orders Placed This Encounter  Procedures   CT HEAD WO CONTRAST ( )    Meds ordered this encounter  Medications   topiramate  (TOPAMAX ) 100 MG tablet    Sig: Take 1 tablet (100 mg total) by mouth at bedtime.     Dispense:  90 tablet    Refill:  3    Return in about 1 year (around 05/24/2025).    Pastor Falling, MD 05/24/2024, 5:55 PM  [facility] [address] [place], [place] [address] [phone]

## 2024-05-24 NOTE — Patient Instructions (Signed)
 Increase topiramate  of 100 to 100 mg nightly, refill given Will give patient samples of Nurtec and Qulipta.   He will contact us  for updates Will repeat head CT Follow-up in 1 year or sooner if worse.

## 2024-05-25 ENCOUNTER — Telehealth: Payer: Self-pay | Admitting: Neurology

## 2024-05-25 NOTE — Telephone Encounter (Signed)
 CT scan sent to [facility] per patient. [phone] [employer] medicare auth: [id number] exp. 05/25/24-06/23/24

## 2024-05-26 NOTE — Telephone Encounter (Signed)
 He called and said he wanted [facility] in [place], not [place]. I changed the location on his PA and on the MRI order for them to call him to schedule. [phone]

## 2024-05-31 ENCOUNTER — Telehealth: Payer: Self-pay

## 2024-05-31 ENCOUNTER — Ambulatory Visit
Admission: RE | Admit: 2024-05-31 | Discharge: 2024-05-31 | Disposition: A | Discharge status: Home / Self Care | Source: Ambulatory Visit | Attending: Neurology | Admitting: Neurology

## 2024-05-31 ENCOUNTER — Other Ambulatory Visit

## 2024-05-31 DIAGNOSIS — G930 Cerebral cysts: Secondary | ICD-10-CM

## 2024-05-31 NOTE — Progress Notes (Signed)
 No telephone call made, attempting to chart on pt regarding CT scan today

## 2024-05-31 NOTE — Telephone Encounter (Signed)
 Patient had order for CT head without contrast at 4 pm today. I had him on my table in CT and I did my scout image. I proceeded to start the CT head scan at 15:59. It took several images and then patient jumped up off the table and he ran to the side of my CT room. I ran in the door and asked him what was wrong. He kept yelling, Nope, Nope, Nope. I asked again what happened and he continued to yell the same phase. I asked him if he could use words to tell me what had happened and he told me that the scanner started and he had a metal taste in his mouth and he also felt something in his heart. He was not receiving any IV contrast. I told him that I had not given him anything. He said this is not right and he was leaving. I was walking him down the hall to leave and we stopped by the PA office and spoke with Holley Candle, PA. I wanted her to hear what he was saying so that someone else could understand what was going on. Pam told him that it was fine for him to leave. I told her that I was going to put a note in to let his provider know why his scan wasn't done.

## 2024-06-01 ENCOUNTER — Encounter: Payer: Self-pay | Admitting: Radiology

## 2025-04-19 ENCOUNTER — Ambulatory Visit

## 2025-05-25 ENCOUNTER — Ambulatory Visit: Admitting: Neurology
